# Patient Record
Sex: Female | Born: 1995 | Race: Black or African American | Hispanic: No | Marital: Single | Smoking: Never smoker
Health system: Southern US, Community
[De-identification: ages and names within clinical notes are randomized; demographics above are authoritative.]

## PROBLEM LIST (undated history)

## (undated) DIAGNOSIS — F988 Other specified behavioral and emotional disorders with onset usually occurring in childhood and adolescence: Secondary | ICD-10-CM

## (undated) DIAGNOSIS — R519 Headache, unspecified: Secondary | ICD-10-CM

## (undated) DIAGNOSIS — L80 Vitiligo: Secondary | ICD-10-CM

## (undated) DIAGNOSIS — M549 Dorsalgia, unspecified: Secondary | ICD-10-CM

## (undated) DIAGNOSIS — F329 Major depressive disorder, single episode, unspecified: Secondary | ICD-10-CM

## (undated) DIAGNOSIS — F419 Anxiety disorder, unspecified: Secondary | ICD-10-CM

## (undated) DIAGNOSIS — R7303 Prediabetes: Secondary | ICD-10-CM

## (undated) DIAGNOSIS — F32A Depression, unspecified: Secondary | ICD-10-CM

## (undated) DIAGNOSIS — G43909 Migraine, unspecified, not intractable, without status migrainosus: Secondary | ICD-10-CM

## (undated) DIAGNOSIS — M2550 Pain in unspecified joint: Secondary | ICD-10-CM

## (undated) DIAGNOSIS — E559 Vitamin D deficiency, unspecified: Secondary | ICD-10-CM

## (undated) DIAGNOSIS — R51 Headache: Secondary | ICD-10-CM

## (undated) HISTORY — DX: Anxiety disorder, unspecified: F41.9

## (undated) HISTORY — DX: Major depressive disorder, single episode, unspecified: F32.9

## (undated) HISTORY — DX: Migraine, unspecified, not intractable, without status migrainosus: G43.909

## (undated) HISTORY — DX: Headache: R51

## (undated) HISTORY — DX: Depression, unspecified: F32.A

## (undated) HISTORY — PX: WISDOM TOOTH EXTRACTION: SHX21

## (undated) HISTORY — DX: Dorsalgia, unspecified: M54.9

## (undated) HISTORY — DX: Vitamin D deficiency, unspecified: E55.9

## (undated) HISTORY — DX: Pain in unspecified joint: M25.50

## (undated) HISTORY — DX: Other specified behavioral and emotional disorders with onset usually occurring in childhood and adolescence: F98.8

## (undated) HISTORY — DX: Prediabetes: R73.03

## (undated) HISTORY — DX: Headache, unspecified: R51.9

---
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
## 2015-11-24 ENCOUNTER — Ambulatory Visit (HOSPITAL_COMMUNITY)
Admission: EM | Admit: 2015-11-24 | Discharge: 2015-11-24 | Disposition: A | Discharge status: Home / Self Care | Payer: BLUE CROSS/BLUE SHIELD | Attending: Emergency Medicine | Admitting: Emergency Medicine

## 2015-11-24 ENCOUNTER — Encounter (HOSPITAL_COMMUNITY): Payer: Self-pay | Admitting: Emergency Medicine

## 2015-11-24 DIAGNOSIS — R102 Pelvic and perineal pain: Secondary | ICD-10-CM | POA: Diagnosis not present

## 2015-11-24 LAB — POCT URINALYSIS DIP (DEVICE)
BILIRUBIN URINE: NEGATIVE
Glucose, UA: NEGATIVE mg/dL
Ketones, ur: NEGATIVE mg/dL
Nitrite: NEGATIVE
PH: 7 (ref 5.0–8.0)
Protein, ur: NEGATIVE mg/dL
SPECIFIC GRAVITY, URINE: 1.015 (ref 1.005–1.030)
UROBILINOGEN UA: 0.2 mg/dL (ref 0.0–1.0)

## 2015-11-24 LAB — POCT PREGNANCY, URINE: Preg Test, Ur: NEGATIVE

## 2015-11-24 NOTE — ED Provider Notes (Signed)
[facility]    CSN: [id number] Arrival date & time: 11/24/15  1701     History   Chief Complaint Chief Complaint  Patient presents with  . Abdominal Pain    HPI [name] is a 20 y.o. female.   HPI  She is a 20 year old woman here for evaluation of pelvic pain. She states this started today. She was at work when she developed a sharp pain in her lower abdomen and pelvis. It was quite severe and made it difficult for her to walk. She went to the bathroom, but did not have a bowel movement. She is not had any vaginal discharge or dysuria. The pain did eventually ease up. Since then she has had intermittent recurring episodes, but they only last maybe a minute at a time. No nausea or vomiting. Last bowel movement was yesterday and normal. Last period was October 17. She did just come off the Nexplanon. She has had unprotected sex, but not recently.  History reviewed. No pertinent past medical history.  There are no active problems to display for this patient.   History reviewed. No pertinent surgical history.  OB History    No data available       Home Medications    Prior to Admission medications   Not on File    Family History No family history on file.  Social History Social History  Substance Use Topics  . Smoking status: Never Smoker  . Smokeless tobacco: Never Used  . Alcohol use Yes     Allergies   Patient has no known allergies.   Review of Systems Review of Systems As in history of present illness  Physical Exam Triage Vital Signs ED Triage Vitals [11/24/15 1739]  Enc Vitals Group     BP 114/84     Pulse Rate 71     Resp 18     Temp 97.9 F (36.6 C)     Temp Source Oral     SpO2 100 %     Weight      Height      Head Circumference      Peak Flow      Pain Score      Pain Loc      Pain Edu?      Excl. in GC?    No data found.   Updated Vital Signs BP 114/84 (BP Location: Left Arm)   Pulse 71   Temp 97.9 F  (36.6 C) (Oral)   Resp 18   LMP 11/01/2015   SpO2 100%   Visual Acuity Right Eye Distance:   Left Eye Distance:   Bilateral Distance:    Right Eye Near:   Left Eye Near:    Bilateral Near:     Physical Exam  Constitutional: She is oriented to person, place, and time. She appears well-developed and well-nourished. No distress.  Cardiovascular: Normal rate.   Pulmonary/Chest: Effort normal.  Abdominal: Soft. Bowel sounds are normal. She exhibits no distension and no mass. There is tenderness (mild in suprapubic). There is no rebound and no guarding.  Genitourinary: There is no rash on the right labia. There is no rash on the left labia. Uterus is not enlarged and not tender. Cervix exhibits no motion tenderness and no discharge. Right adnexum displays no tenderness. Left adnexum displays no tenderness. There is bleeding in the vagina. No foreign body in the vagina. No vaginal discharge found.  Neurological: She is alert and oriented to person, place,  and time.     UC Treatments / Results  Labs (all labs ordered are listed, but only abnormal results are displayed) Labs Reviewed  POCT URINALYSIS DIP (DEVICE) - Abnormal; Notable for the following:       Result Value   Hgb urine dipstick LARGE (*)    Leukocytes, UA TRACE (*)    All other components within normal limits  URINE CULTURE  POCT PREGNANCY, URINE  CERVICOVAGINAL ANCILLARY ONLY    EKG  EKG Interpretation None       Radiology No results found.  Procedures Procedures (including critical care time)  Medications Ordered in UC Medications - No data to display   Initial Impression / Assessment and Plan / UC Course  I have reviewed the triage vital signs and the nursing notes.  Pertinent labs & imaging results that were available during my care of the patient were reviewed by me and considered in my medical decision making (see chart for details).  Clinical Course     STD testing collected. Urine sent for  culture. Suspect her pain is due to her menses starting. Recommended Tylenol or ibuprofen. Follow-up in the emergency room if pain persists or worsens.  Final Clinical Impressions(s) / UC Diagnoses   Final diagnoses:  Pelvic pain in female    New Prescriptions There are no discharge medications for this patient.    Charm RingsErin J Rodrigo Mcgranahan, MD 11/24/15 [phone]

## 2015-11-24 NOTE — ED Triage Notes (Signed)
Here for intermittent sharp pelvic pain onset today  Pain increased when she was walking  LBM - yest   Denies fevers, chills, urinary sx, n/v

## 2015-11-24 NOTE — Discharge Instructions (Signed)
We are checking for infection. There is no sign of anything bad going on. The pain may be related to the onset of your period. Take some ibuprofen tonight. If the pain comes back and is persistent, please go to the emergency room.

## 2015-11-25 LAB — URINE CULTURE

## 2015-11-25 LAB — CERVICOVAGINAL ANCILLARY ONLY: Wet Prep (BD Affirm): POSITIVE — AB

## 2015-11-30 ENCOUNTER — Telehealth (HOSPITAL_COMMUNITY): Payer: Self-pay | Admitting: Emergency Medicine

## 2015-11-30 LAB — CERVICOVAGINAL ANCILLARY ONLY
Chlamydia: NEGATIVE
Neisseria Gonorrhea: NEGATIVE

## 2015-11-30 NOTE — Telephone Encounter (Signed)
-----   Message from Eustace MooreLaura W Murray, MD sent at 11/27/2015  8:15 PM EST ----- Please let patient know that urine culture did not demonstrate a UTI.   Test for gardnerella (bacterial vaginosis) was positive, but only needs to be treated if there are symptoms such as vaginal irritation or discharge. If having vaginal irritation/discharge, ok to send rx for metronidazole 500mg  bid x 7d #14 no refills.  LM

## 2015-12-02 NOTE — Telephone Encounter (Signed)
Called [phone], some one picked up but line was hanged/cut off.... Called again and LM on VM  Need to give lab results and to see how pt is doing from recent visit on 11/15 Notified pt in gen message that there is NO need to call back Unless not feeling any better, not tolerating meds well or if wanting to know lab results. Also let pt know labs can be obtained from MyChart

## 2017-01-15 HISTORY — PX: MOUTH SURGERY: SHX715

## 2018-03-26 ENCOUNTER — Ambulatory Visit (INDEPENDENT_AMBULATORY_CARE_PROVIDER_SITE_OTHER): Payer: Managed Care, Other (non HMO) | Admitting: Nurse Practitioner

## 2018-03-26 ENCOUNTER — Other Ambulatory Visit (HOSPITAL_COMMUNITY)
Admission: RE | Admit: 2018-03-26 | Discharge: 2018-03-26 | Disposition: A | Discharge status: Home / Self Care | Payer: Managed Care, Other (non HMO) | Source: Ambulatory Visit | Attending: Nurse Practitioner | Admitting: Nurse Practitioner

## 2018-03-26 ENCOUNTER — Encounter: Payer: Self-pay | Admitting: Nurse Practitioner

## 2018-03-26 ENCOUNTER — Other Ambulatory Visit: Payer: Self-pay

## 2018-03-26 VITALS — BP 126/76 | HR 105 | Temp 97.9°F | Ht 64.5 in | Wt 243.6 lb

## 2018-03-26 DIAGNOSIS — Z124 Encounter for screening for malignant neoplasm of cervix: Secondary | ICD-10-CM | POA: Diagnosis not present

## 2018-03-26 DIAGNOSIS — Z0000 Encounter for general adult medical examination without abnormal findings: Secondary | ICD-10-CM | POA: Diagnosis not present

## 2018-03-26 DIAGNOSIS — Z114 Encounter for screening for human immunodeficiency virus [HIV]: Secondary | ICD-10-CM | POA: Diagnosis not present

## 2018-03-26 DIAGNOSIS — Z136 Encounter for screening for cardiovascular disorders: Secondary | ICD-10-CM

## 2018-03-26 DIAGNOSIS — N760 Acute vaginitis: Secondary | ICD-10-CM

## 2018-03-26 DIAGNOSIS — Z0001 Encounter for general adult medical examination with abnormal findings: Secondary | ICD-10-CM

## 2018-03-26 DIAGNOSIS — Z13220 Encounter for screening for lipoid disorders: Secondary | ICD-10-CM

## 2018-03-26 DIAGNOSIS — Z23 Encounter for immunization: Secondary | ICD-10-CM

## 2018-03-26 DIAGNOSIS — L80 Vitiligo: Secondary | ICD-10-CM

## 2018-03-26 DIAGNOSIS — B9689 Other specified bacterial agents as the cause of diseases classified elsewhere: Secondary | ICD-10-CM

## 2018-03-26 DIAGNOSIS — Z113 Encounter for screening for infections with a predominantly sexual mode of transmission: Secondary | ICD-10-CM

## 2018-03-26 NOTE — Assessment & Plan Note (Signed)
Advised about the importance of healthy diet and regular exercise to promote weight loss

## 2018-03-26 NOTE — Progress Notes (Addendum)
Subjective:    Patient ID: Jodi Downs, female    DOB: July 15, 1995, 23 y.o.   MRN: [id number]  Patient presents today for complete physical   HPI  previous pcp and GYN in [place], [place]. None seen in over 3years due to lack of insurance.  Hx of ADHD and Anxiety: use of medication while in high school and 1st year of college.  Hx of migraines  Hx of vitiligo: evaluated by dermatology in [place], [place]  Sexual History (orientation,birth control, marital status, STD):no hx of abnormal PAP Home with female partner. Female partner in past  Depression/Suicide:stable mood at this time per patient Depression screen [facility] 2/9 03/26/2018  Decreased Interest 0  Down, Depressed, Hopeless 0  PHQ - 2 Score 0   Vision:up to date, use of corrective lens  Dental: will schedule  Immunizations: (TDAP, Hep C screen, Pneumovax, Influenza, zoster)  Health Maintenance  Topic Date Due  . Tetanus Vaccine  07/16/2012  . PAP-Cervical Cytology Screening  05/07/2018*  . Pap Smear  05/07/2018*  . Flu Shot  Completed  . HIV Screening  Completed  *Topic was postponed. The date shown is not the original due date.   Diet: regular.  Weight:  Wt Readings from Last 3 Encounters:  03/26/18 243 lb 9.6 oz (110.5 kg)   Exercise:none  Fall Risk: Fall Risk  03/26/2018  Falls in the past year? 0   Medications and allergies reviewed with patient and updated if appropriate.  Patient Active Problem List   Diagnosis Date Noted  . Vitiligo 03/26/2018  . Morbid obesity (HCC) 03/26/2018    No current outpatient medications on file prior to visit.   No current facility-administered medications on file prior to visit.     Past Medical History:  Diagnosis Date  . Depression   . Frequent headaches   . Migraines     Past Surgical History:  Procedure Laterality Date  . MOUTH SURGERY Bilateral 2019   wisdom teeth removal , tooth extraction w/ bone graph 2019  . TONSILLECTOMY AND ADENOIDECTOMY  2000   . WISDOM TOOTH EXTRACTION      Social History   Socioeconomic History  . Marital status: Single    Spouse name: Not on file  . Number of children: Not on file  . Years of education: Not on file  . Highest education level: Not on file  Occupational History  . Not on file  Social Needs  . Financial resource strain: Not on file  . Food insecurity:    Worry: Not on file    Inability: Not on file  . Transportation needs:    Medical: Not on file    Non-medical: Not on file  Tobacco Use  . Smoking status: Never Smoker  . Smokeless tobacco: Never Used  Substance and Sexual Activity  . Alcohol use: Yes    Comment: social  . Drug use: No  . Sexual activity: Yes    Birth control/protection: Condom  Lifestyle  . Physical activity:    Days per week: Not on file    Minutes per session: Not on file  . Stress: Not on file  Relationships  . Social connections:    Talks on phone: Not on file    Gets together: Not on file    Attends religious service: Not on file    Active member of club or organization: Not on file    Attends meetings of clubs or organizations: Not on file    Relationship status: Not  on file  Other Topics Concern  . Not on file  Social History Narrative  . Not on file    Family History  Problem Relation Age of Onset  . Asthma Mother   . Hyperlipidemia Mother   . Hypertension Mother   . Heart disease Father   . Hyperlipidemia Father   . Hypertension Father   . Alcohol abuse Father   . Learning disabilities Brother   . Intellectual disability Brother   . Diabetes Maternal Grandmother   . Stroke Maternal Grandmother   . Diabetes Paternal Grandfather   . Diabetes Maternal Aunt        Review of Systems  Constitutional: Negative for fever, malaise/fatigue and weight loss.  HENT: Negative for congestion and sore throat.   Eyes:       Negative for visual changes  Respiratory: Negative for cough and shortness of breath.   Cardiovascular: Negative for  chest pain, palpitations and leg swelling.  Gastrointestinal: Negative for blood in stool, constipation, diarrhea, heartburn and nausea.  Genitourinary: Negative for dysuria, frequency and urgency.  Musculoskeletal: Negative for falls, joint pain and myalgias.  Skin: Negative for rash.  Neurological: Negative for dizziness, sensory change and headaches.  Endo/Heme/Allergies: Does not bruise/bleed easily.  Psychiatric/Behavioral: Negative for depression, substance abuse and suicidal ideas. The patient is not nervous/anxious and does not have insomnia.     Objective:   Vitals:   03/26/18 1452  BP: 126/76  Pulse: (!) 105  Temp: 97.9 F (36.6 C)  SpO2: 97%    Body mass index is 41.17 kg/m.  Physical Examination:  Physical Exam Vitals signs reviewed. Exam conducted with a chaperone present.  Cardiovascular:     Rate and Rhythm: Normal rate and regular rhythm.  Chest:     Breasts:        Right: Normal.        Left: Normal.  Genitourinary:    General: Normal vulva.     Vagina: Normal.     Cervix: Normal.     Adnexa: Right adnexa normal and left adnexa normal.  Lymphadenopathy:     Upper Body:     Right upper body: No supraclavicular, axillary or pectoral adenopathy.     Left upper body: No supraclavicular, axillary or pectoral adenopathy.     Lower Body: No right inguinal adenopathy. No left inguinal adenopathy.  Neurological:     Mental Status: She is alert.    ASSESSMENT and PLAN:  Marvette was seen today for establish care.  Diagnoses and all orders for this visit:  Encounter for preventative adult health care exam with abnormal findings -     Cancel: CBC -     Comprehensive metabolic panel -     Cytology - PAP( [place]) -     CBC w/Diff  Encounter for lipid screening for cardiovascular disease  Encounter for Papanicolaou smear for cervical cancer screening -     Cytology - PAP( [place])  Morbid obesity (HCC) -     TSH -     Lipid panel -      Hemoglobin A1c  Encounter for screening for human immunodeficiency virus (HIV) -     HIV antibody (with reflex)  Screening examination for STD (sexually transmitted disease) -     Cervicovaginal ancillary only( [place])  Need for diphtheria-tetanus-pertussis (Tdap) vaccine -     Cancel: Tdap vaccine greater than or equal to 7yo IM  Need for influenza vaccination -     Flu  Vaccine QUAD 36+ mos IM  Vitiligo  BV (bacterial vaginosis) -     metroNIDAZOLE (METROGEL VAGINAL) 0.75 % vaginal gel; Place 1 Applicatorful vaginally at bedtime.   Morbid obesity (HCC) Advised about the importance of healthy diet and regular exercise to promote weight loss  Vitiligo Hypopigmentation of right leg     Problem List Items Addressed This Visit      Musculoskeletal and Integument   Vitiligo    Hypopigmentation of right leg        Other   Morbid obesity (HCC)    Advised about the importance of healthy diet and regular exercise to promote weight loss      Relevant Orders   TSH (Completed)   Lipid panel (Completed)   Hemoglobin A1c (Completed)    Other Visit Diagnoses    Encounter for preventative adult health care exam with abnormal findings    -  Primary   Relevant Orders   Comprehensive metabolic panel (Completed)   Cytology - PAP( [place])   CBC w/Diff (Completed)   Encounter for lipid screening for cardiovascular disease       Encounter for Papanicolaou smear for cervical cancer screening       Relevant Orders   Cytology - PAP( [place])   Encounter for screening for human immunodeficiency virus (HIV)       Relevant Orders   HIV antibody (with reflex) (Completed)   Screening examination for STD (sexually transmitted disease)       Relevant Orders   Cervicovaginal ancillary only( ) (Completed)   Need for diphtheria-tetanus-pertussis (Tdap) vaccine       Need for influenza vaccination       Relevant Orders   Flu Vaccine QUAD 36+ mos IM (Completed)    BV (bacterial vaginosis)       Relevant Medications   metroNIDAZOLE (METROGEL VAGINAL) 0.75 % vaginal gel       Follow up: Return in about 4 weeks (around 04/23/2018) for anxiety and weight management ( ).  Alysia Penna, NP

## 2018-03-26 NOTE — Assessment & Plan Note (Signed)
Hypopigmentation of right leg

## 2018-03-26 NOTE — Patient Instructions (Addendum)
Go to lab for blood draw. You will be contacted with results.  Strongly encourage to start heart healthy diet and regular exercise

## 2018-03-27 ENCOUNTER — Encounter: Payer: Self-pay | Admitting: Nurse Practitioner

## 2018-03-27 LAB — COMPREHENSIVE METABOLIC PANEL
ALBUMIN: 4 g/dL (ref 3.5–5.2)
ALK PHOS: 54 U/L (ref 39–117)
ALT: 10 U/L (ref 0–35)
AST: 18 U/L (ref 0–37)
BILIRUBIN TOTAL: 0.3 mg/dL (ref 0.2–1.2)
BUN: 16 mg/dL (ref 6–23)
CO2: 27 mEq/L (ref 19–32)
CREATININE: 0.76 mg/dL (ref 0.40–1.20)
Calcium: 8.7 mg/dL (ref 8.4–10.5)
Chloride: 106 mEq/L (ref 96–112)
GFR: 112.48 mL/min (ref >60.00)
Glucose, Bld: 86 mg/dL (ref 70–99)
Potassium: 3.8 mEq/L (ref 3.5–5.1)
SODIUM: 139 meq/L (ref 135–145)
Total Protein: 7.2 g/dL (ref 6.0–8.3)

## 2018-03-27 LAB — CBC WITH DIFFERENTIAL/PLATELET
BASOS ABS: 0.1 10*3/uL (ref 0.0–0.1)
Basophils Relative: 0.9 % (ref 0.0–3.0)
Eosinophils Absolute: 0.1 10*3/uL (ref 0.0–0.7)
Eosinophils Relative: 1.2 % (ref 0.0–5.0)
HEMATOCRIT: 32.2 % — AB (ref 36.0–46.0)
HEMOGLOBIN: 10.5 g/dL — AB (ref 12.0–15.0)
LYMPHS ABS: 2.4 10*3/uL (ref 0.7–4.0)
LYMPHS PCT: 34.6 % (ref 12.0–46.0)
MCHC: 32.5 g/dL (ref 30.0–36.0)
MCV: 84.2 fl (ref 78.0–100.0)
MONOS PCT: 6.5 % (ref 3.0–12.0)
Monocytes Absolute: 0.4 10*3/uL (ref 0.1–1.0)
NEUTROS PCT: 56.8 % (ref 43.0–77.0)
Neutro Abs: 3.9 10*3/uL (ref 1.4–7.7)
Platelets: 322 10*3/uL (ref 150.0–400.0)
RBC: 3.83 Mil/uL — AB (ref 3.87–5.11)
RDW: 14.8 % (ref 11.5–15.5)
WBC: 6.8 10*3/uL (ref 4.0–10.5)

## 2018-03-27 LAB — HEMOGLOBIN A1C: HEMOGLOBIN A1C: 5.6 % (ref 4.6–6.5)

## 2018-03-27 LAB — TSH: TSH: 2.78 u[IU]/mL (ref 0.35–4.50)

## 2018-03-27 LAB — LIPID PANEL
CHOLESTEROL: 144 mg/dL (ref 0–200)
HDL: 50.8 mg/dL (ref >39.00)
LDL Cholesterol: 82 mg/dL (ref 0–99)
NonHDL: 93.68
TRIGLYCERIDES: 59 mg/dL (ref 0.0–149.0)
Total CHOL/HDL Ratio: 3
VLDL: 11.8 mg/dL (ref 0.0–40.0)

## 2018-03-27 LAB — HIV ANTIBODY (ROUTINE TESTING W REFLEX): HIV: NONREACTIVE

## 2018-03-28 LAB — CERVICOVAGINAL ANCILLARY ONLY
Bacterial vaginitis: POSITIVE — AB
CANDIDA VAGINITIS: NEGATIVE
CHLAMYDIA, DNA PROBE: NEGATIVE
NEISSERIA GONORRHEA: NEGATIVE
TRICH (WINDOWPATH): NEGATIVE

## 2018-03-28 MED ORDER — METRONIDAZOLE 0.75 % VA GEL: 1.0000 | Freq: Every day | VAGINAL | 0 refills | Status: DC

## 2018-03-28 NOTE — Addendum Note (Signed)
Addended by: Michaela Corner on: 03/28/2018 05:41 PM   Modules accepted: Orders

## 2018-03-31 LAB — CYTOLOGY - PAP: DIAGNOSIS: NEGATIVE

## 2018-04-22 ENCOUNTER — Ambulatory Visit: Payer: Managed Care, Other (non HMO) | Admitting: Nurse Practitioner

## 2021-02-15 DIAGNOSIS — Z419 Encounter for procedure for purposes other than remedying health state, unspecified: Secondary | ICD-10-CM | POA: Diagnosis not present

## 2021-02-17 LAB — OB RESULTS CONSOLE ABO/RH: RH Type: POSITIVE

## 2021-02-17 LAB — OB RESULTS CONSOLE RUBELLA ANTIBODY, IGM: Rubella: IMMUNE

## 2021-02-17 LAB — OB RESULTS CONSOLE HEPATITIS B SURFACE ANTIGEN: Hepatitis B Surface Ag: NEGATIVE

## 2021-02-17 LAB — HEPATITIS C ANTIBODY: HCV Ab: NEGATIVE

## 2021-02-17 LAB — OB RESULTS CONSOLE HIV ANTIBODY (ROUTINE TESTING): HIV: NONREACTIVE

## 2021-02-17 LAB — OB RESULTS CONSOLE RPR: RPR: NONREACTIVE

## 2021-02-17 LAB — OB RESULTS CONSOLE ANTIBODY SCREEN: Antibody Screen: NEGATIVE

## 2021-03-15 DIAGNOSIS — Z419 Encounter for procedure for purposes other than remedying health state, unspecified: Secondary | ICD-10-CM | POA: Diagnosis not present

## 2021-04-15 DIAGNOSIS — Z419 Encounter for procedure for purposes other than remedying health state, unspecified: Secondary | ICD-10-CM | POA: Diagnosis not present

## 2021-05-15 DIAGNOSIS — Z419 Encounter for procedure for purposes other than remedying health state, unspecified: Secondary | ICD-10-CM | POA: Diagnosis not present

## 2021-05-16 ENCOUNTER — Other Ambulatory Visit: Payer: Self-pay | Admitting: Obstetrics & Gynecology

## 2021-05-16 DIAGNOSIS — Z363 Encounter for antenatal screening for malformations: Secondary | ICD-10-CM

## 2021-05-26 ENCOUNTER — Encounter: Payer: Self-pay | Admitting: *Deleted

## 2021-05-31 ENCOUNTER — Ambulatory Visit: Payer: BC Managed Care – PPO | Attending: Obstetrics & Gynecology

## 2021-05-31 ENCOUNTER — Encounter: Payer: Self-pay | Admitting: *Deleted

## 2021-05-31 ENCOUNTER — Ambulatory Visit: Payer: BC Managed Care – PPO | Admitting: *Deleted

## 2021-05-31 ENCOUNTER — Other Ambulatory Visit: Payer: Self-pay | Admitting: *Deleted

## 2021-05-31 VITALS — BP 113/66 | HR 99

## 2021-05-31 DIAGNOSIS — Z6841 Body Mass Index (BMI) 40.0 and over, adult: Secondary | ICD-10-CM

## 2021-05-31 DIAGNOSIS — Z362 Encounter for other antenatal screening follow-up: Secondary | ICD-10-CM

## 2021-05-31 DIAGNOSIS — Z363 Encounter for antenatal screening for malformations: Secondary | ICD-10-CM | POA: Diagnosis not present

## 2021-05-31 DIAGNOSIS — R638 Other symptoms and signs concerning food and fluid intake: Secondary | ICD-10-CM

## 2021-05-31 IMAGING — US US MFM OB DETAIL+14 WK
1 series · 13 of 28 positions shown · non-contrast
Comparison: none

[Series 1: us mfm ob detail+14 wk · 139 acquisitions, 13 frames shown]
[im 6/139]
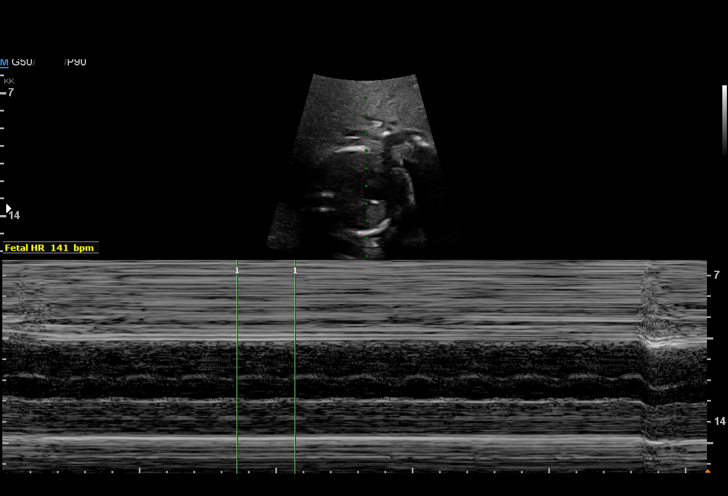
[im 16/139]
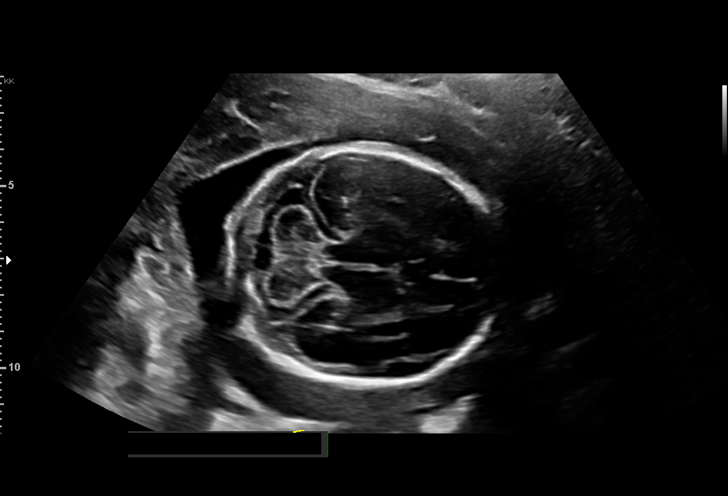
[im 26/139]
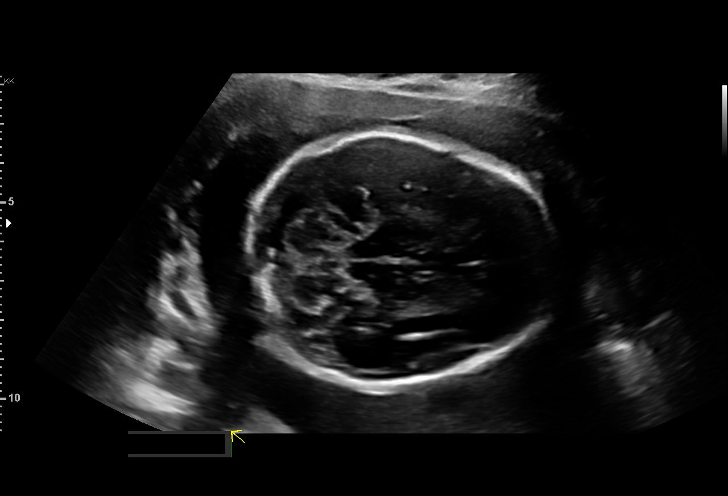
[im 36/139]
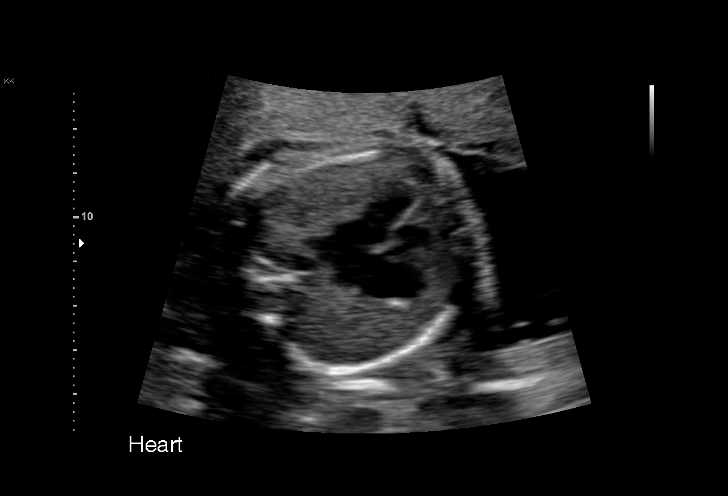
[im 47/139]
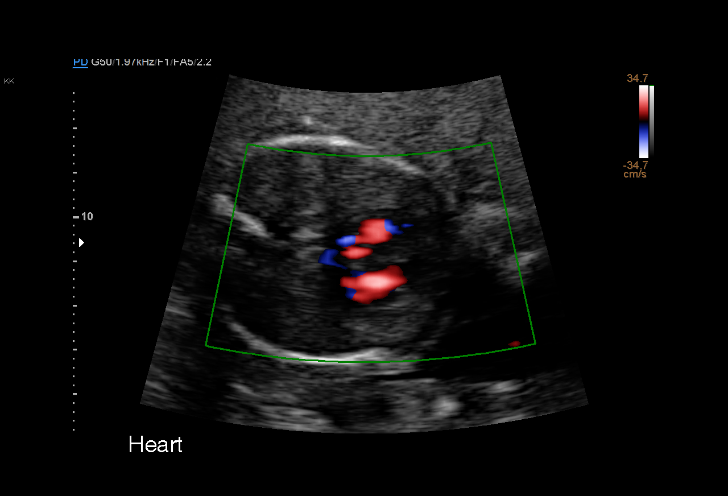
[im 57/139]
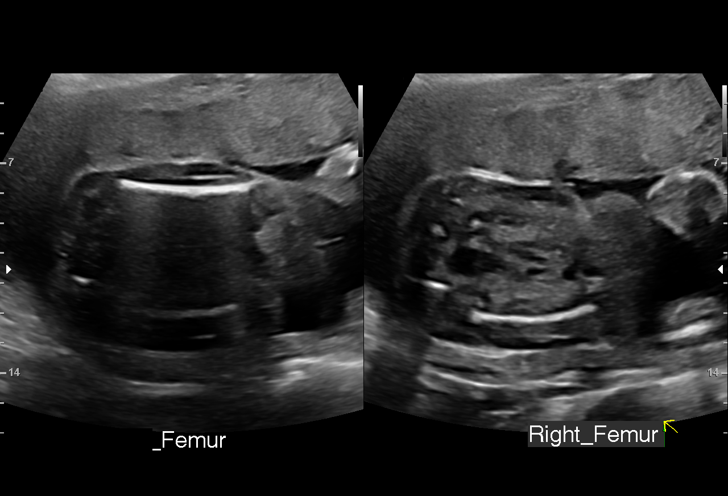
[im 72/139]
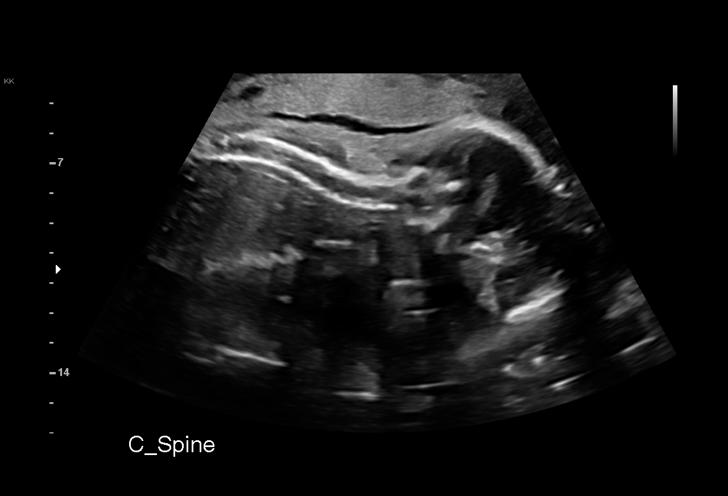
[im 82/139]
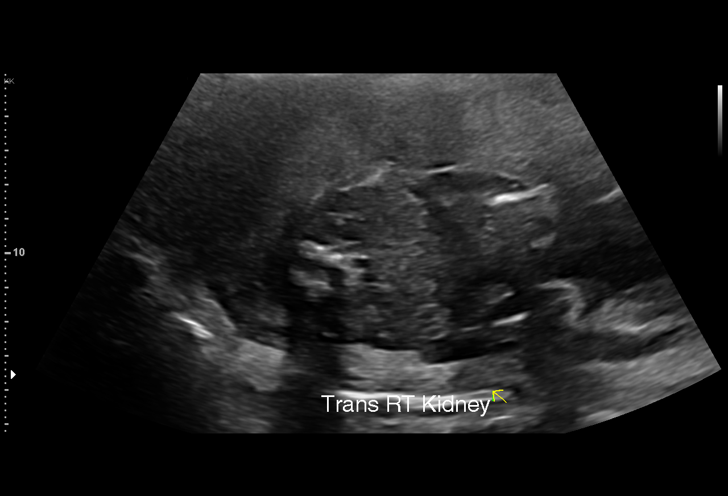
[im 93/139]
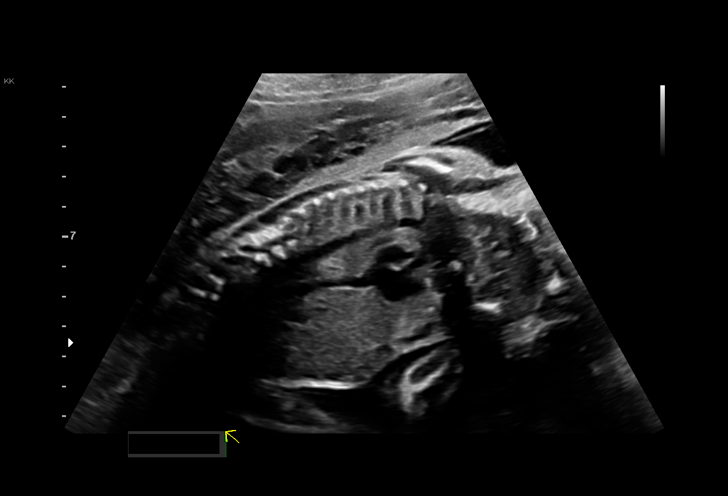
[im 103/139]
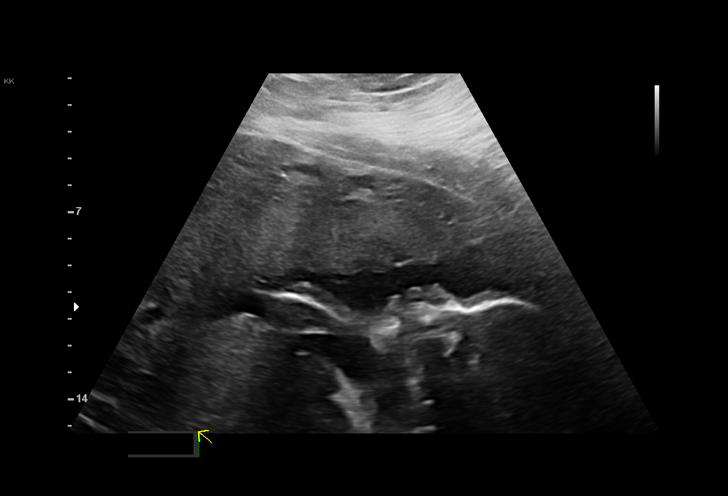
[im 113/139]
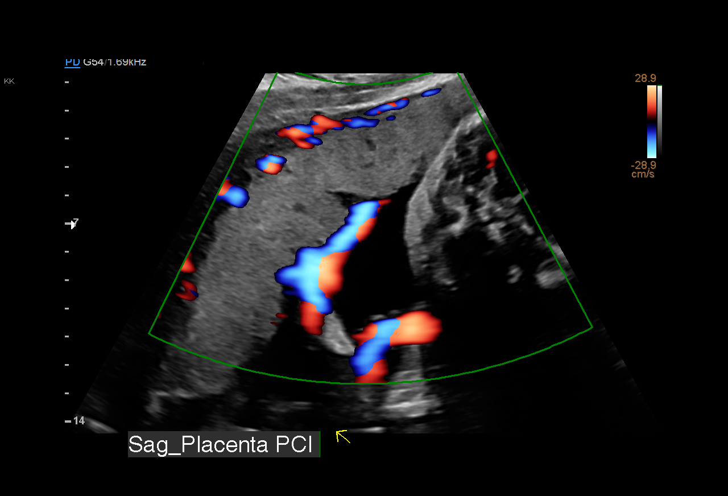
[im 123/139]
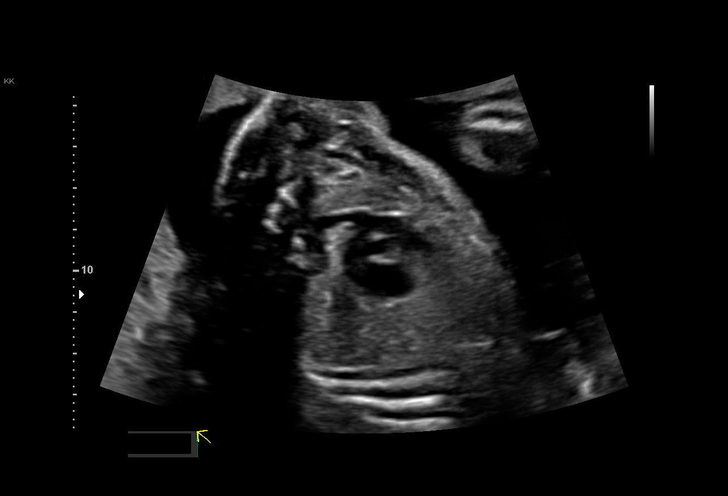
[im 133/139]
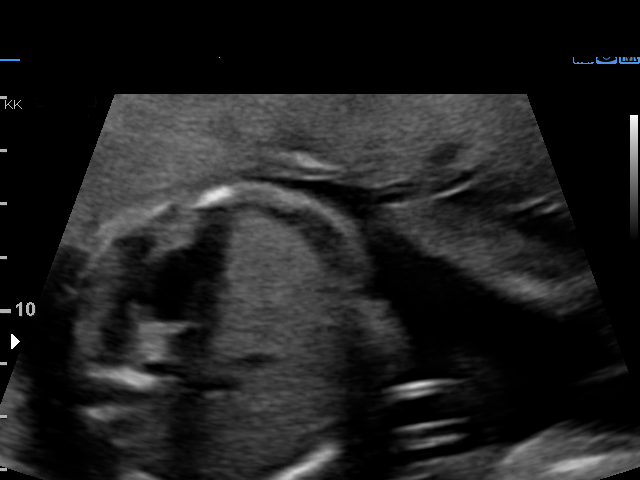

[13 of 28 positions shown; findings below may reference images not displayed]

OB

Indications

 Obesity complicating pregnancy, second         [IH]
 trimester (BMI 41)
 25 weeks gestation of pregnancy
 Encounter for antenatal screening for          [IH]
 malformations
 Fetal abnormality - other known or             [IH]
 suspected (Persistent Right Umbilical Vein)
 Low Risk NIPS(Negative Horizon)
Vital Signs

 BMI:
Fetal Evaluation

 Num Of Fetuses:         1
 Fetal Heart Rate(bpm):  141
 Cardiac Activity:       Observed
 Presentation:           Cephalic
 Placenta:               Anterior
 P. Cord Insertion:      Visualized

 Amniotic Fluid
 AFI FV:      Within normal limits

                             Largest Pocket(cm)

Biometry

 BPD:      63.5  mm     G. Age:  25w 5d         67  %    CI:        74.79   %    70 - 86
                                                         FL/HC:      19.5   %    18.7 -
 HC:       233   mm     G. Age:  25w 2d         41  %    HC/AC:      1.14        1.04 -
 AC:      205.2  mm     G. Age:  25w 1d         44  %    FL/BPD:     71.7   %    71 - 87
 FL:       45.5  mm     G. Age:  25w 0d         38  %    FL/AC:      22.2   %    20 - 24
 HUM:      45.1  mm     G. Age:  26w 5d         85  %
 CER:      28.6  mm     G. Age:  25w 2d         60  %

 LV:        4.5  mm
 CM:        4.2  mm

 Est. FW:     775  gm    1 lb 11 oz      46  %
OB History

 Gravidity:    1
Gestational Age

 LMP:           24w 0d        Date:  [DATE]                 EDD:   [DATE]
 U/S Today:     25w 2d                                        EDD:   [DATE]
 Best:          25w 0d     Det. By:  Early Ultrasound         EDD:   [DATE]
                                     ([DATE])
Anatomy

 Cranium:               Appears normal         Aortic Arch:            Appears normal
 Cavum:                 Appears normal         Ductal Arch:            Appears normal
 Ventricles:            Appears normal         Diaphragm:              Appears normal
 Choroid Plexus:        Appears normal         Stomach:                Appears normal, left
                                                                       sided
 Cerebellum:            Appears normal         Abdomen:                Persistent right
                                                                       umbilical vein
 Posterior Fossa:       Appears normal         Abdominal Wall:         Appears nml (cord
                                                                       insert, abd wall)
 Nuchal Fold:           Not applicable (>20    Cord Vessels:           Appears normal (3
                        wks GA)                                        vessel cord)
 Face:                  Appears normal         Kidneys:                Appear normal
                        (orbits and profile)
 Lips:                  Appears normal         Bladder:                Appears normal
 Thoracic:              Appears normal         Spine:                  Not well visualized
 Heart:                 Appears normal         Upper Extremities:      Appears normal
                        (4CH, axis, and
                        situs)
 RVOT:                  Not well visualized    Lower Extremities:      Appears normal
 LVOT:                  Not well visualized

 Other:  Fetus appears to be a male. VC, 3VV and 3VTV visualized.
         Technically difficult due to maternal habitus and fetal position.
Cervix Uterus Adnexa

 Cervix
 Length:           3.07  cm.
 Normal appearance by transabdominal scan.
 Adnexa
 No abnormality visualized.
Impression

 Single intrauterine pregnancy here for a detailed anatomy
 Normal anatomy with measurements consistent with dates
 There is good fetal movement and amniotic fluid volume
 Suboptimal views of the fetal anatomy were obtained
 secondary to fetal position.

 We observed an isolated intrahepatic persistent right
 umbilical vein PRUV. There are no markers of aneuploidy
 and she has a low risk NIPS. I explained that typically the
 right umbilical vein obliterates and the left persists. PRUV is
 suggested when a medial gallbladder is seen with the
 umbilical vein coursing around it toward the stomach. PRUV
 can be intrahepatic or extrahepatic. Extrahepatic is more rare
 finding and often associated with a single umbilical artery and
 anomalies. Trisomy 18 is the most common associated
 aneuploidy. No markers of aneuploidy were seen today.
Recommendations

 Follow up growth in 4 weeks to complete the fetal anatomy.

## 2021-06-15 DIAGNOSIS — Z419 Encounter for procedure for purposes other than remedying health state, unspecified: Secondary | ICD-10-CM | POA: Diagnosis not present

## 2021-06-30 ENCOUNTER — Ambulatory Visit: Payer: BC Managed Care – PPO

## 2021-07-07 ENCOUNTER — Ambulatory Visit: Payer: Managed Care, Other (non HMO)

## 2021-07-07 ENCOUNTER — Ambulatory Visit: Payer: BC Managed Care – PPO | Attending: Maternal & Fetal Medicine

## 2021-07-12 ENCOUNTER — Ambulatory Visit: Payer: BC Managed Care – PPO | Admitting: *Deleted

## 2021-07-12 ENCOUNTER — Ambulatory Visit: Payer: BC Managed Care – PPO | Attending: Maternal & Fetal Medicine

## 2021-07-12 VITALS — BP 110/69 | HR 105

## 2021-07-12 DIAGNOSIS — R638 Other symptoms and signs concerning food and fluid intake: Secondary | ICD-10-CM | POA: Diagnosis present

## 2021-07-12 DIAGNOSIS — Z3A31 31 weeks gestation of pregnancy: Secondary | ICD-10-CM | POA: Diagnosis not present

## 2021-07-12 DIAGNOSIS — Z362 Encounter for other antenatal screening follow-up: Secondary | ICD-10-CM | POA: Diagnosis present

## 2021-07-12 DIAGNOSIS — O99213 Obesity complicating pregnancy, third trimester: Secondary | ICD-10-CM | POA: Insufficient documentation

## 2021-07-12 DIAGNOSIS — O358XX0 Maternal care for other (suspected) fetal abnormality and damage, not applicable or unspecified: Secondary | ICD-10-CM | POA: Diagnosis not present

## 2021-07-12 DIAGNOSIS — E669 Obesity, unspecified: Secondary | ICD-10-CM

## 2021-07-12 DIAGNOSIS — Z6841 Body Mass Index (BMI) 40.0 and over, adult: Secondary | ICD-10-CM | POA: Insufficient documentation

## 2021-07-13 ENCOUNTER — Other Ambulatory Visit: Payer: Self-pay | Admitting: *Deleted

## 2021-07-13 DIAGNOSIS — Z6841 Body Mass Index (BMI) 40.0 and over, adult: Secondary | ICD-10-CM

## 2021-07-15 DIAGNOSIS — Z419 Encounter for procedure for purposes other than remedying health state, unspecified: Secondary | ICD-10-CM | POA: Diagnosis not present

## 2021-08-03 ENCOUNTER — Other Ambulatory Visit: Payer: Self-pay | Admitting: *Deleted

## 2021-08-03 ENCOUNTER — Ambulatory Visit: Payer: BC Managed Care – PPO | Attending: Obstetrics and Gynecology | Admitting: *Deleted

## 2021-08-03 ENCOUNTER — Ambulatory Visit: Payer: BC Managed Care – PPO

## 2021-08-03 ENCOUNTER — Ambulatory Visit (HOSPITAL_BASED_OUTPATIENT_CLINIC_OR_DEPARTMENT_OTHER): Payer: BC Managed Care – PPO

## 2021-08-03 VITALS — BP 96/50 | HR 102

## 2021-08-03 DIAGNOSIS — O99213 Obesity complicating pregnancy, third trimester: Secondary | ICD-10-CM | POA: Insufficient documentation

## 2021-08-03 DIAGNOSIS — E669 Obesity, unspecified: Secondary | ICD-10-CM | POA: Diagnosis not present

## 2021-08-03 DIAGNOSIS — Z3A34 34 weeks gestation of pregnancy: Secondary | ICD-10-CM | POA: Insufficient documentation

## 2021-08-03 DIAGNOSIS — O283 Abnormal ultrasonic finding on antenatal screening of mother: Secondary | ICD-10-CM

## 2021-08-03 DIAGNOSIS — O99210 Obesity complicating pregnancy, unspecified trimester: Secondary | ICD-10-CM

## 2021-08-03 DIAGNOSIS — O359XX0 Maternal care for (suspected) fetal abnormality and damage, unspecified, not applicable or unspecified: Secondary | ICD-10-CM

## 2021-08-03 DIAGNOSIS — Z6841 Body Mass Index (BMI) 40.0 and over, adult: Secondary | ICD-10-CM

## 2021-08-10 ENCOUNTER — Ambulatory Visit: Payer: BC Managed Care – PPO | Admitting: *Deleted

## 2021-08-10 ENCOUNTER — Encounter: Payer: Self-pay | Admitting: *Deleted

## 2021-08-10 ENCOUNTER — Ambulatory Visit: Payer: BC Managed Care – PPO | Attending: Obstetrics and Gynecology

## 2021-08-10 VITALS — BP 119/59 | HR 104

## 2021-08-10 DIAGNOSIS — O99213 Obesity complicating pregnancy, third trimester: Secondary | ICD-10-CM | POA: Insufficient documentation

## 2021-08-10 DIAGNOSIS — E669 Obesity, unspecified: Secondary | ICD-10-CM | POA: Diagnosis not present

## 2021-08-10 DIAGNOSIS — O358XX0 Maternal care for other (suspected) fetal abnormality and damage, not applicable or unspecified: Secondary | ICD-10-CM

## 2021-08-10 DIAGNOSIS — Z3A35 35 weeks gestation of pregnancy: Secondary | ICD-10-CM | POA: Diagnosis not present

## 2021-08-10 DIAGNOSIS — O26893 Other specified pregnancy related conditions, third trimester: Secondary | ICD-10-CM | POA: Diagnosis present

## 2021-08-10 DIAGNOSIS — Z6841 Body Mass Index (BMI) 40.0 and over, adult: Secondary | ICD-10-CM

## 2021-08-15 DIAGNOSIS — Z419 Encounter for procedure for purposes other than remedying health state, unspecified: Secondary | ICD-10-CM | POA: Diagnosis not present

## 2021-08-16 ENCOUNTER — Ambulatory Visit: Payer: BC Managed Care – PPO | Admitting: *Deleted

## 2021-08-16 ENCOUNTER — Ambulatory Visit: Payer: BC Managed Care – PPO | Attending: Obstetrics and Gynecology

## 2021-08-16 VITALS — BP 105/77 | HR 137

## 2021-08-16 DIAGNOSIS — Z3A36 36 weeks gestation of pregnancy: Secondary | ICD-10-CM | POA: Diagnosis not present

## 2021-08-16 DIAGNOSIS — Z6841 Body Mass Index (BMI) 40.0 and over, adult: Secondary | ICD-10-CM | POA: Diagnosis present

## 2021-08-16 DIAGNOSIS — O359XX0 Maternal care for (suspected) fetal abnormality and damage, unspecified, not applicable or unspecified: Secondary | ICD-10-CM | POA: Diagnosis not present

## 2021-08-16 DIAGNOSIS — O358XX0 Maternal care for other (suspected) fetal abnormality and damage, not applicable or unspecified: Secondary | ICD-10-CM | POA: Diagnosis not present

## 2021-08-16 DIAGNOSIS — O99213 Obesity complicating pregnancy, third trimester: Secondary | ICD-10-CM | POA: Diagnosis not present

## 2021-08-16 DIAGNOSIS — E669 Obesity, unspecified: Secondary | ICD-10-CM | POA: Insufficient documentation

## 2021-08-18 LAB — OB RESULTS CONSOLE GBS: GBS: NEGATIVE

## 2021-08-25 ENCOUNTER — Ambulatory Visit: Payer: BC Managed Care – PPO | Admitting: *Deleted

## 2021-08-25 ENCOUNTER — Encounter: Payer: Self-pay | Admitting: *Deleted

## 2021-08-25 ENCOUNTER — Ambulatory Visit: Payer: BC Managed Care – PPO | Attending: Maternal & Fetal Medicine

## 2021-08-25 VITALS — BP 112/57 | HR 98

## 2021-08-25 DIAGNOSIS — Z6841 Body Mass Index (BMI) 40.0 and over, adult: Secondary | ICD-10-CM

## 2021-08-25 DIAGNOSIS — E669 Obesity, unspecified: Secondary | ICD-10-CM

## 2021-08-25 DIAGNOSIS — O35BXX0 Maternal care for other (suspected) fetal abnormality and damage, fetal cardiac anomalies, not applicable or unspecified: Secondary | ICD-10-CM | POA: Diagnosis not present

## 2021-08-25 DIAGNOSIS — Z3A37 37 weeks gestation of pregnancy: Secondary | ICD-10-CM

## 2021-08-25 DIAGNOSIS — O99213 Obesity complicating pregnancy, third trimester: Secondary | ICD-10-CM | POA: Diagnosis present

## 2021-08-25 DIAGNOSIS — O283 Abnormal ultrasonic finding on antenatal screening of mother: Secondary | ICD-10-CM | POA: Diagnosis present

## 2021-09-01 ENCOUNTER — Ambulatory Visit: Payer: BC Managed Care – PPO | Attending: Maternal & Fetal Medicine

## 2021-09-01 ENCOUNTER — Other Ambulatory Visit: Payer: Self-pay | Admitting: Obstetrics and Gynecology

## 2021-09-01 ENCOUNTER — Other Ambulatory Visit: Payer: Self-pay | Admitting: *Deleted

## 2021-09-01 ENCOUNTER — Ambulatory Visit: Payer: BC Managed Care – PPO | Admitting: *Deleted

## 2021-09-01 VITALS — BP 115/70 | HR 114

## 2021-09-01 DIAGNOSIS — Z3A38 38 weeks gestation of pregnancy: Secondary | ICD-10-CM | POA: Diagnosis not present

## 2021-09-01 DIAGNOSIS — O358XX0 Maternal care for other (suspected) fetal abnormality and damage, not applicable or unspecified: Secondary | ICD-10-CM | POA: Diagnosis not present

## 2021-09-01 DIAGNOSIS — O283 Abnormal ultrasonic finding on antenatal screening of mother: Secondary | ICD-10-CM | POA: Diagnosis present

## 2021-09-01 DIAGNOSIS — O99213 Obesity complicating pregnancy, third trimester: Secondary | ICD-10-CM | POA: Insufficient documentation

## 2021-09-01 DIAGNOSIS — E669 Obesity, unspecified: Secondary | ICD-10-CM

## 2021-09-04 ENCOUNTER — Other Ambulatory Visit: Payer: Self-pay | Admitting: Obstetrics & Gynecology

## 2021-09-06 ENCOUNTER — Ambulatory Visit: Payer: BC Managed Care – PPO | Admitting: *Deleted

## 2021-09-06 ENCOUNTER — Telehealth (HOSPITAL_COMMUNITY): Payer: Self-pay | Admitting: *Deleted

## 2021-09-06 ENCOUNTER — Encounter: Payer: Self-pay | Admitting: *Deleted

## 2021-09-06 ENCOUNTER — Ambulatory Visit: Payer: BC Managed Care – PPO | Attending: Maternal & Fetal Medicine

## 2021-09-06 DIAGNOSIS — Z3A39 39 weeks gestation of pregnancy: Secondary | ICD-10-CM

## 2021-09-06 DIAGNOSIS — O35BXX0 Maternal care for other (suspected) fetal abnormality and damage, fetal cardiac anomalies, not applicable or unspecified: Secondary | ICD-10-CM

## 2021-09-06 DIAGNOSIS — E669 Obesity, unspecified: Secondary | ICD-10-CM | POA: Diagnosis not present

## 2021-09-06 DIAGNOSIS — O283 Abnormal ultrasonic finding on antenatal screening of mother: Secondary | ICD-10-CM | POA: Diagnosis present

## 2021-09-06 DIAGNOSIS — O99213 Obesity complicating pregnancy, third trimester: Secondary | ICD-10-CM | POA: Diagnosis present

## 2021-09-06 NOTE — Telephone Encounter (Signed)
Preadmission screen  

## 2021-09-07 ENCOUNTER — Encounter (HOSPITAL_COMMUNITY): Payer: Self-pay | Admitting: *Deleted

## 2021-09-08 ENCOUNTER — Inpatient Hospital Stay (HOSPITAL_COMMUNITY)
Admission: AD | Admit: 2021-09-08 | Discharge: 2021-09-08 | Disposition: A | Discharge status: Home / Self Care | Payer: BC Managed Care – PPO | Attending: Obstetrics & Gynecology | Admitting: Obstetrics & Gynecology

## 2021-09-08 ENCOUNTER — Encounter (HOSPITAL_COMMUNITY): Payer: Self-pay | Admitting: Obstetrics & Gynecology

## 2021-09-08 DIAGNOSIS — Z3A39 39 weeks gestation of pregnancy: Secondary | ICD-10-CM | POA: Insufficient documentation

## 2021-09-08 DIAGNOSIS — O368330 Maternal care for abnormalities of the fetal heart rate or rhythm, third trimester, not applicable or unspecified: Secondary | ICD-10-CM | POA: Diagnosis present

## 2021-09-08 DIAGNOSIS — Z3689 Encounter for other specified antenatal screening: Secondary | ICD-10-CM | POA: Diagnosis not present

## 2021-09-08 HISTORY — DX: Vitiligo: L80

## 2021-09-08 NOTE — MAU Provider Note (Signed)
History     CSN: [id number]  Arrival date and time: 09/08/21 1256   Event Date/Time   First Provider Initiated Contact with Patient 09/08/21 1347      No chief complaint on file.  HPI Patient Jodi Downs is a 26 y.o. G1P0  At [redacted]w[redacted]d here after having nonreactive NST at her appt today at [facility]. Per report, she had NRNST with variables.She denies vaginal bleeding, contractions, LOF. She endorses strong fetal movements. She reports that she had not eaten before her appt today and that as of now (1356), she had only eaten crackers and juice thus far.  OB History     Gravida  1   Para      Term      Preterm      AB      Living         SAB      IAB      Ectopic      Multiple      Live Births              Past Medical History:  Diagnosis Date   Anxiety    Depression    Frequent headaches    Migraines    Vitamin D deficiency    Vitiligo     Past Surgical History:  Procedure Laterality Date   MOUTH SURGERY Bilateral 2019   wisdom teeth removal , tooth extraction w/ bone graph 2019   TONSILLECTOMY AND ADENOIDECTOMY  2000   WISDOM TOOTH EXTRACTION      Family History  Problem Relation Age of Onset   Asthma Mother    Hyperlipidemia Mother    Hypertension Mother    Heart disease Father    Hyperlipidemia Father    Hypertension Father    Alcohol abuse Father    Learning disabilities Brother    Intellectual disability Brother    Diabetes Maternal Aunt    Diabetes Maternal Grandmother    Stroke Maternal Grandmother    Diabetes Paternal Grandfather    Cancer Neg Hx     Social History   Tobacco Use   Smoking status: Never   Smokeless tobacco: Never  Vaping Use   Vaping Use: Never used  Substance Use Topics   Alcohol use: Not Currently    Comment: social   Drug use: No    Allergies: No Known Allergies  Medications Prior to Admission  Medication Sig Dispense Refill Last Dose   diphenhydrAMINE (BENADRYL) 25 MG tablet Take 25 mg by mouth  every 6 (six) hours as needed for allergies (dog).   09/07/2021   Prenatal Vit-Fe Fumarate-FA (PRENATAL MULTIVITAMIN) TABS tablet Take 1 tablet by mouth daily at 12 noon.   09/08/2021    Review of Systems  Constitutional: Negative.   HENT: Negative.    Respiratory: Negative.    Cardiovascular: Negative.   Gastrointestinal: Negative.   Genitourinary: Negative.   Musculoskeletal: Negative.   Neurological: Negative.    Physical Exam   Blood pressure 137/81, pulse (!) 109, temperature 98.2 F (36.8 C), temperature source Oral, resp. rate 20, height 5' 4.5" (1.638 m), weight 125.7 kg, last menstrual period 12/14/2020, SpO2 99 %.  Physical Exam Constitutional:      Appearance: Normal appearance.  Cardiovascular:     Rate and Rhythm: Normal rate.  Pulmonary:     Effort: Pulmonary effort is normal.  Abdominal:     General: Abdomen is flat.  Skin:    General: Skin is warm.  Neurological:  General: No focal deficit present.     Mental Status: She is alert and oriented to person, place, and time.  Psychiatric:        Mood and Affect: Mood normal.        Behavior: Behavior normal.     MAU Course  Procedures  MDM -patient had not eaten other than crackers and juice today; she was given more snacks in MAU and felt strong fetal movements; audible kicks heard. Due to fetal movements, it was difficult to trace baby but tracing that was achieved was very reassuring.   -NST: 135 bpm, mod var, present acel, no decels, no contractions Assessment and Plan   1. NST (non-stress test) reactive   -patient given information on kick counts, she verbalized understanding of when to return to MAU (stressed low threshold for returning to MAU if any concern for baby's movements) -other return precautions reviewed   Charlesetta Garibaldi Jodi Downs 09/08/2021, 1:51 PM

## 2021-09-08 NOTE — MAU Note (Addendum)
Jodi Downs is a 26 y.o. at [redacted]w[redacted]d here in MAU reporting: office had called, pt for induction at 40wks  (BMI).  Non-reactive FH today in office.  Here for further monitoring. No bleeding or leaking. Denies pain. Does feel baby move.  Onset of complaint: today Pain score: none Vitals:   09/08/21 1319  BP: 137/81  Pulse: (!) 109  Resp: 20  Temp: 98.2 F (36.8 C)  SpO2: 99%     FHT:162 Lab orders placed from triage:  none

## 2021-09-11 LAB — OB RESULTS CONSOLE GC/CHLAMYDIA
Chlamydia: NEGATIVE
Neisseria Gonorrhea: NEGATIVE

## 2021-09-13 ENCOUNTER — Inpatient Hospital Stay (HOSPITAL_COMMUNITY)
Admission: AD | Admit: 2021-09-13 | Discharge: 2021-09-17 | DRG: 787 | Disposition: A | Discharge status: Home / Self Care | Payer: BC Managed Care – PPO | Attending: Obstetrics & Gynecology | Admitting: Obstetrics & Gynecology

## 2021-09-13 ENCOUNTER — Other Ambulatory Visit: Payer: Self-pay

## 2021-09-13 ENCOUNTER — Inpatient Hospital Stay (HOSPITAL_COMMUNITY): Payer: BC Managed Care – PPO

## 2021-09-13 ENCOUNTER — Encounter (HOSPITAL_COMMUNITY): Payer: Self-pay | Admitting: Obstetrics & Gynecology

## 2021-09-13 DIAGNOSIS — Z3A40 40 weeks gestation of pregnancy: Secondary | ICD-10-CM

## 2021-09-13 DIAGNOSIS — O9081 Anemia of the puerperium: Secondary | ICD-10-CM | POA: Diagnosis not present

## 2021-09-13 DIAGNOSIS — Z419 Encounter for procedure for purposes other than remedying health state, unspecified: Secondary | ICD-10-CM | POA: Diagnosis not present

## 2021-09-13 DIAGNOSIS — D62 Acute posthemorrhagic anemia: Secondary | ICD-10-CM | POA: Diagnosis not present

## 2021-09-13 DIAGNOSIS — O99214 Obesity complicating childbirth: Principal | ICD-10-CM | POA: Diagnosis present

## 2021-09-13 DIAGNOSIS — O358XX0 Maternal care for other (suspected) fetal abnormality and damage, not applicable or unspecified: Principal | ICD-10-CM | POA: Diagnosis present

## 2021-09-13 DIAGNOSIS — Z98891 History of uterine scar from previous surgery: Secondary | ICD-10-CM

## 2021-09-13 DIAGNOSIS — B081 Molluscum contagiosum: Secondary | ICD-10-CM

## 2021-09-13 LAB — CBC
HCT: 33.1 % — ABNORMAL LOW (ref 36.0–46.0)
Hemoglobin: 11.2 g/dL — ABNORMAL LOW (ref 12.0–15.0)
MCH: 28.7 pg (ref 26.0–34.0)
MCHC: 33.8 g/dL (ref 30.0–36.0)
MCV: 84.9 fL (ref 80.0–100.0)
Platelets: 244 10*3/uL (ref 150–400)
RBC: 3.9 MIL/uL (ref 3.87–5.11)
RDW: 14.5 % (ref 11.5–15.5)
WBC: 7 10*3/uL (ref 4.0–10.5)
nRBC: 0 % (ref 0.0–0.2)

## 2021-09-13 LAB — TYPE AND SCREEN
ABO/RH(D): O POS
Antibody Screen: NEGATIVE

## 2021-09-13 MED ORDER — MISOPROSTOL 25 MCG QUARTER TABLET
25.0000 ug | ORAL_TABLET | ORAL | Status: DC
  Administered 2021-09-13: 25 ug via VAGINAL
  Filled 2021-09-13: qty 1

## 2021-09-13 MED ORDER — TERBUTALINE SULFATE 1 MG/ML IJ SOLN: 0.2500 mg | Freq: Once | INTRAMUSCULAR | Status: DC | PRN

## 2021-09-13 MED ORDER — SOD CITRATE-CITRIC ACID 500-334 MG/5ML PO SOLN
30.0000 mL | ORAL | Status: DC | PRN
  Filled 2021-09-13 (×2): qty 30

## 2021-09-13 MED ORDER — LIDOCAINE HCL (PF) 1 % IJ SOLN: 30.0000 mL | INTRAMUSCULAR | Status: DC | PRN

## 2021-09-13 MED ORDER — OXYTOCIN-SODIUM CHLORIDE 30-0.9 UT/500ML-% IV SOLN: 2.5000 [IU]/h | INTRAVENOUS | Status: DC

## 2021-09-13 MED ORDER — LACTATED RINGERS IV SOLN: INTRAVENOUS | Status: DC

## 2021-09-13 MED ORDER — MISOPROSTOL 25 MCG QUARTER TABLET
25.0000 ug | ORAL_TABLET | ORAL | Status: DC
  Administered 2021-09-13: 25 ug via ORAL
  Filled 2021-09-13: qty 1

## 2021-09-13 MED ORDER — OXYTOCIN BOLUS FROM INFUSION: 333.0000 mL | Freq: Once | INTRAVENOUS | Status: DC

## 2021-09-13 MED ORDER — ACETAMINOPHEN 325 MG PO TABS: 650.0000 mg | ORAL_TABLET | ORAL | Status: DC | PRN

## 2021-09-13 MED ORDER — ONDANSETRON HCL 4 MG/2ML IJ SOLN: 4.0000 mg | Freq: Four times a day (QID) | INTRAMUSCULAR | Status: DC | PRN

## 2021-09-13 MED ORDER — OXYTOCIN-SODIUM CHLORIDE 30-0.9 UT/500ML-% IV SOLN: 1.0000 m[IU]/min | INTRAVENOUS | Status: DC

## 2021-09-13 MED ORDER — TERBUTALINE SULFATE 1 MG/ML IJ SOLN
0.2500 mg | Freq: Once | INTRAMUSCULAR | Status: DC | PRN
  Filled 2021-09-13: qty 1

## 2021-09-13 MED ORDER — LACTATED RINGERS IV SOLN
500.0000 mL | INTRAVENOUS | Status: DC | PRN
  Administered 2021-09-13 – 2021-09-14 (×2): 500 mL via INTRAVENOUS

## 2021-09-13 NOTE — H&P (Addendum)
Jodi Downs is a 26 y.o. female presenting for induction of labor for high BMI.  G1P0 at 40 weeks with LMP 12/14/20 and EDD 09/13/21 by first trimester U/S.   Prenatal course significant for: Persistent intrahepatic right umbilical vein noted on anatomy and subsequent fetal ultrasounds.  BMI at new OB visit in first trimester was 48.  Elevated 1 hr GTT, normal 3 hr GTT.   OB History     Gravida  1   Para      Term      Preterm      AB      Living         SAB      IAB      Ectopic      Multiple      Live Births             Past Medical History:  Diagnosis Date   Anxiety    Depression    Frequent headaches    Migraines    Vitamin D deficiency    Vitiligo    Past Surgical History:  Procedure Laterality Date   MOUTH SURGERY Bilateral 2019   wisdom teeth removal , tooth extraction w/ bone graph 2019   TONSILLECTOMY AND ADENOIDECTOMY  2000   WISDOM TOOTH EXTRACTION     Family History: family history includes Alcohol abuse in her father; Asthma in her mother; Diabetes in her maternal aunt, maternal grandmother, and paternal grandfather; Heart disease in her father; Hyperlipidemia in her father and mother; Hypertension in her father and mother; Intellectual disability in her brother; Learning disabilities in her brother; Stroke in her maternal grandmother. Social History:  reports that she has never smoked. She has never used smokeless tobacco. She reports that she does not currently use alcohol. She reports that she does not use drugs.     Maternal Diabetes: No Genetic Screening: Normal Maternal Ultrasounds/Referrals: Other:  09/01/21: EFW 7lbs 7 oz (57th%), normal AFI.  Fetal Ultrasounds or other Referrals:  None Maternal Substance Abuse:  No Significant Maternal Medications:  None Significant Maternal Lab Results:  Group B Strep negative Number of Prenatal Visits:greater than 3 verified prenatal visits Other Comments:  None  Review of  Systems History ROS  Constitutional: Denies fevers/chills Cardiovascular: Denies chest pain or palpitations Pulmonary: Denies coughing or wheezing Gastrointestinal: Denies nausea, vomiting or diarrhea Genitourinary: Denies pelvic pain, unusual vaginal bleeding, unusual vaginal discharge, dysuria, urgency or frequency.  Musculoskeletal: Denies muscle or joint aches and pain.  Neurology: Denies abnormal sensations such as tingling or numbness.   Dilation: Fingertip Effacement (%): Thick Station: -3 Exam by:: Jodi Mallory, RN Blood pressure 115/79, pulse (!) 106, temperature 98.3 F (36.8 C), temperature source Oral, resp. rate 16, height 5\' 4"  (1.626 m), weight 126.2 kg, last menstrual period 12/14/2020. Exam Physical Exam  Constitutional: She is oriented to person, place, and time. She appears well-developed and well-nourished.  HENT:  Head: Normocephalic and atraumatic.  Neck: Normal range of motion.  Cardiovascular: Normal rate.    Respiratory: Effort normal.   GI: Soft.  Genitorurinary: Gravid uterus, appropriate for gestational age.   Neurological: She is alert and oriented to person, place, and time.  Skin: Skin is warm and dry.  Psychiatric: She has a normal mood and affect. Her behavior is normal. EFM:160 baseline, moderate variability,  reactive TOCO: Irregular every 10  minutes  CVX: 0.5 cm/thick/-3 per RN 12/16/2020, Jodi Downs.  Prenatal labs: ABO, Rh: --/--/PENDING (08/30 2225) Antibody: PENDING (08/30 2225)  Rubella: Immune (02/03 0000) RPR: Nonreactive (02/03 0000)  HBsAg: Negative (02/03 0000)  HIV: Non-reactive (02/03 0000)  GBS: Negative/-- (08/04 0000)   Assessment/Plan: 26 y/o G1P0 here for induction of labor for high BMI,  - Admit to Labor and Delivery. - Plan is to start cervical ripening with concurrent buccal and vaginal cytotec. Induction plan has previously been discussed with the patient at office who agreed with the plan.    Jodi Sours, MD.   09/13/2021, 11:23 PM

## 2021-09-14 LAB — RPR: RPR Ser Ql: NONREACTIVE

## 2021-09-14 MED ORDER — HYDROXYZINE HCL 25 MG PO TABS
25.0000 mg | ORAL_TABLET | Freq: Three times a day (TID) | ORAL | Status: DC | PRN
  Administered 2021-09-14 – 2021-09-16 (×4): 25 mg via ORAL
  Filled 2021-09-14 (×4): qty 1

## 2021-09-14 MED ORDER — OXYTOCIN-SODIUM CHLORIDE 30-0.9 UT/500ML-% IV SOLN
1.0000 m[IU]/min | INTRAVENOUS | Status: DC
  Administered 2021-09-14: 1 m[IU]/min via INTRAVENOUS
  Administered 2021-09-15: 4 m[IU]/min via INTRAVENOUS
  Filled 2021-09-14: qty 500

## 2021-09-14 NOTE — Progress Notes (Signed)
Jodi Downs is a 26 y.o. G1P0 at [redacted]w[redacted]d  Subjective: No complaints.  Not feeling ctxs.  Objective: BP (!) 117/54   Pulse 96   Temp 98.2 F (36.8 C) (Oral)   Resp 16   Ht 5\' 4"  (1.626 m)   Wt 126.2 kg   LMP 12/14/2020   BMI 47.77 kg/m  No intake/output data recorded. No intake/output data recorded.  FHT:  FHR: 135 bpm, variability: moderate,  accelerations:  Present,  decelerations:  Absent UC:   regular, every 3-4 minutes SVE:   Dilation: Fingertip Effacement (%): Thick Station: -3 Exam by:: Dr. [email or web address]  Labs: Lab Results  Component Value Date   WBC 7.0 09/13/2021   HGB 11.2 (L) 09/13/2021   HCT 33.1 (L) 09/13/2021   MCV 84.9 09/13/2021   PLT 244 09/13/2021    Assessment / Plan: IOL  Labor:  Titrate pitocin as indicated.  S/p placement of foley balloon at about 1300. Preeclampsia:  no signs or symptoms of toxicity Fetal Wellbeing:  Category I Pain Control:   pain med upon request I/D:   GBS neg Anticipated MOD:  NSVD  09/15/2021, MD 09/14/2021, 1:31 PM

## 2021-09-14 NOTE — Progress Notes (Signed)
   Subjective:  Patient feels mild contractions intermittently . Reports they have awaken her. She request to postpone rupture of membranes. +FM no lof.  She is keeping options for pain control open.   Objective: BP 118/83   Pulse (!) 108   Temp 98.3 F (36.8 C) (Oral)   Resp 18   Ht 5\' 4"  (1.626 m)   Wt 126.2 kg   LMP 12/14/2020   BMI 47.77 kg/m  No intake/output data recorded. No intake/output data recorded.  FHT:  FHR: 140 bpm, variability: moderate,  accelerations:  Present,  decelerations:  Absent UC:   regular, every 1-2 minutes SVE:   Dilation: 3.5 Effacement (%): 70, 80 Station: -3 Exam by:: [email or web address], MD  Labs: Lab Results  Component Value Date   WBC 7.0 09/13/2021   HGB 11.2 (L) 09/13/2021   HCT 33.1 (L) 09/13/2021   MCV 84.9 09/13/2021   PLT 244 09/13/2021    Assessment / Plan: Induction of labor due to morbid obesity   Labor:  continue to increase pitocin as long as tolerated by fetus.. plan AROM with fetal descent.  Preeclampsia:   NA Fetal Wellbeing:  Category I Pain Control:  Labor support without medications I/D:  n/a Anticipated MOD:  NSVD  09/15/2021, MD 09/14/2021, 11:14 PM

## 2021-09-14 NOTE — Progress Notes (Signed)
Jodi Downs is a 26 y.o. G1P0 at [redacted]w[redacted]d   Subjective: No complaints.  Wanted something for anxiety.  Declined pain medication.  Objective: BP 120/67   Pulse (!) 105   Temp 98.2 F (36.8 C) (Oral)   Resp 16   Ht 5\' 4"  (1.626 m)   Wt 126.2 kg   LMP 12/14/2020   BMI 47.77 kg/m  No intake/output data recorded. No intake/output data recorded.  FHT:  FHR: 135 bpm, variability: moderate,  accelerations:  Present,  decelerations:  Absent except occas variable UC:   regular, every 2-3 minutes SVE:   Dilation: 3 (difficult exam d/t pt crawling up the bed she states d/t anxiety and cervix is so posterior) Effacement (%): Thick Station: -3 Exam by:: Dr. [email or web address]  Labs: Lab Results  Component Value Date   WBC 7.0 09/13/2021   HGB 11.2 (L) 09/13/2021   HCT 33.1 (L) 09/13/2021   MCV 84.9 09/13/2021   PLT 244 09/13/2021    Assessment / Plan: Induction of labor due to BMI,  progressing well on pitocin s/p foley balloon placement at 1230 and came out at 1530  Labor:  cont to titrate pitocin as indicated Preeclampsia:  no signs or symptoms of toxicity Fetal Wellbeing:  Category I occas cat 2 episodes Pain Control:   pain medication upon request I/D:   GBS neg Anticipated MOD:  NSVD  09/15/2021, MD 09/14/2021, 3:18 PM

## 2021-09-15 ENCOUNTER — Encounter (HOSPITAL_COMMUNITY)
Admission: AD | Disposition: A | Discharge status: Home / Self Care | Payer: Self-pay | Source: Home / Self Care | Attending: Obstetrics & Gynecology

## 2021-09-15 ENCOUNTER — Encounter (HOSPITAL_COMMUNITY): Payer: Self-pay | Admitting: Obstetrics & Gynecology

## 2021-09-15 ENCOUNTER — Inpatient Hospital Stay (HOSPITAL_COMMUNITY): Payer: BC Managed Care – PPO | Admitting: Anesthesiology

## 2021-09-15 ENCOUNTER — Other Ambulatory Visit: Payer: Self-pay

## 2021-09-15 SURGERY — Surgical Case
Anesthesia: Epidural

## 2021-09-15 MED ORDER — LACTATED RINGERS IV BOLUS
500.0000 mL | Freq: Once | INTRAVENOUS | Status: AC
Start: 2021-09-15 — End: 2021-09-15
  Administered 2021-09-15: 500 mL via INTRAVENOUS

## 2021-09-15 MED ORDER — FENTANYL CITRATE (PF) 100 MCG/2ML IJ SOLN
INTRAMUSCULAR | Status: DC | PRN
Start: 2021-09-15 — End: 2021-09-15
  Administered 2021-09-15: 100 ug via INTRAVENOUS

## 2021-09-15 MED ORDER — LACTATED RINGERS AMNIOINFUSION: INTRAVENOUS | Status: DC

## 2021-09-15 MED ORDER — DEXAMETHASONE SODIUM PHOSPHATE 4 MG/ML IJ SOLN
INTRAMUSCULAR | Status: DC | PRN
  Administered 2021-09-15: 10 mg via INTRAVENOUS

## 2021-09-15 MED ORDER — ALBUMIN HUMAN 5 % IV SOLN: INTRAVENOUS | Status: DC | PRN

## 2021-09-15 MED ORDER — EPHEDRINE 5 MG/ML INJ: 10.0000 mg | INTRAVENOUS | Status: DC | PRN

## 2021-09-15 MED ORDER — WITCH HAZEL-GLYCERIN EX PADS: 1.0000 | MEDICATED_PAD | CUTANEOUS | Status: DC | PRN

## 2021-09-15 MED ORDER — DEXTROSE 5 % IV SOLN
INTRAVENOUS | Status: DC | PRN
  Administered 2021-09-15: 3 g via INTRAVENOUS

## 2021-09-15 MED ORDER — COCONUT OIL OIL: 1.0000 | TOPICAL_OIL | Status: DC | PRN

## 2021-09-15 MED ORDER — TRANEXAMIC ACID-NACL 1000-0.7 MG/100ML-% IV SOLN
INTRAVENOUS | Status: AC
  Filled 2021-09-15: qty 100

## 2021-09-15 MED ORDER — FENTANYL CITRATE (PF) 100 MCG/2ML IJ SOLN
INTRAMUSCULAR | Status: AC
  Filled 2021-09-15: qty 2

## 2021-09-15 MED ORDER — SIMETHICONE 80 MG PO CHEW
80.0000 mg | CHEWABLE_TABLET | Freq: Three times a day (TID) | ORAL | Status: DC
  Administered 2021-09-16 – 2021-09-17 (×4): 80 mg via ORAL
  Filled 2021-09-15 (×4): qty 1

## 2021-09-15 MED ORDER — LIDOCAINE HCL (PF) 1 % IJ SOLN
INTRAMUSCULAR | Status: DC | PRN
  Administered 2021-09-15 (×2): 4 mL via EPIDURAL

## 2021-09-15 MED ORDER — LACTATED RINGERS IV SOLN: 500.0000 mL | Freq: Once | INTRAVENOUS | Status: DC

## 2021-09-15 MED ORDER — MORPHINE SULFATE (PF) 0.5 MG/ML IJ SOLN
INTRAMUSCULAR | Status: DC | PRN
  Administered 2021-09-15: 3 mg via EPIDURAL

## 2021-09-15 MED ORDER — FENTANYL CITRATE (PF) 100 MCG/2ML IJ SOLN: 25.0000 ug | INTRAMUSCULAR | Status: DC | PRN

## 2021-09-15 MED ORDER — FENTANYL-BUPIVACAINE-NACL 0.5-0.125-0.9 MG/250ML-% EP SOLN
12.0000 mL/h | EPIDURAL | Status: DC | PRN
  Administered 2021-09-15: 12 mL/h via EPIDURAL
  Filled 2021-09-15: qty 250

## 2021-09-15 MED ORDER — PHENYLEPHRINE 80 MCG/ML (10ML) SYRINGE FOR IV PUSH (FOR BLOOD PRESSURE SUPPORT)
80.0000 ug | PREFILLED_SYRINGE | INTRAVENOUS | Status: DC | PRN
  Administered 2021-09-15: 80 ug via INTRAVENOUS
  Filled 2021-09-15: qty 10

## 2021-09-15 MED ORDER — SIMETHICONE 80 MG PO CHEW: 80.0000 mg | CHEWABLE_TABLET | ORAL | Status: DC | PRN

## 2021-09-15 MED ORDER — SCOPOLAMINE 1 MG/3DAYS TD PT72
1.0000 | MEDICATED_PATCH | TRANSDERMAL | Status: DC
  Administered 2021-09-15: 1.5 mg via TRANSDERMAL
  Filled 2021-09-15: qty 1

## 2021-09-15 MED ORDER — OXYTOCIN-SODIUM CHLORIDE 30-0.9 UT/500ML-% IV SOLN
INTRAVENOUS | Status: AC
  Filled 2021-09-15: qty 500

## 2021-09-15 MED ORDER — ONDANSETRON HCL 4 MG/2ML IJ SOLN
4.0000 mg | Freq: Four times a day (QID) | INTRAMUSCULAR | Status: DC | PRN
Start: 2021-09-15 — End: 2021-09-17
  Administered 2021-09-15: 4 mg via INTRAVENOUS
  Filled 2021-09-15: qty 2

## 2021-09-15 MED ORDER — ACETAMINOPHEN 500 MG PO TABS
1000.0000 mg | ORAL_TABLET | Freq: Four times a day (QID) | ORAL | Status: DC
  Administered 2021-09-16 – 2021-09-17 (×6): 1000 mg via ORAL
  Filled 2021-09-15 (×6): qty 2

## 2021-09-15 MED ORDER — TETANUS-DIPHTH-ACELL PERTUSSIS 5-2.5-18.5 LF-MCG/0.5 IM SUSY: 0.5000 mL | PREFILLED_SYRINGE | Freq: Once | INTRAMUSCULAR | Status: DC

## 2021-09-15 MED ORDER — PHENYLEPHRINE HCL-NACL 20-0.9 MG/250ML-% IV SOLN
INTRAVENOUS | Status: DC | PRN
  Administered 2021-09-15: 60 ug/min via INTRAVENOUS

## 2021-09-15 MED ORDER — STERILE WATER FOR IRRIGATION IR SOLN
Status: DC | PRN
  Administered 2021-09-15: 1000 mL

## 2021-09-15 MED ORDER — TRANEXAMIC ACID-NACL 1000-0.7 MG/100ML-% IV SOLN
INTRAVENOUS | Status: DC | PRN
  Administered 2021-09-15: 1000 mg via INTRAVENOUS

## 2021-09-15 MED ORDER — DIBUCAINE (PERIANAL) 1 % EX OINT: 1.0000 | TOPICAL_OINTMENT | CUTANEOUS | Status: DC | PRN

## 2021-09-15 MED ORDER — LACTATED RINGERS IV BOLUS
1000.0000 mL | Freq: Once | INTRAVENOUS | Status: DC
Start: 2021-09-15 — End: 2021-09-15

## 2021-09-15 MED ORDER — PRENATAL MULTIVITAMIN CH
1.0000 | ORAL_TABLET | Freq: Every day | ORAL | Status: DC
  Administered 2021-09-16 – 2021-09-17 (×2): 1 via ORAL
  Filled 2021-09-15 (×2): qty 1

## 2021-09-15 MED ORDER — FENTANYL CITRATE (PF) 100 MCG/2ML IJ SOLN
INTRAMUSCULAR | Status: DC | PRN
  Administered 2021-09-15: 100 ug via EPIDURAL

## 2021-09-15 MED ORDER — OXYCODONE-ACETAMINOPHEN 5-325 MG PO TABS: 1.0000 | ORAL_TABLET | ORAL | Status: DC | PRN

## 2021-09-15 MED ORDER — LACTATED RINGERS IV SOLN: INTRAVENOUS | Status: DC

## 2021-09-15 MED ORDER — DEXMEDETOMIDINE HCL IN NACL 80 MCG/20ML IV SOLN
INTRAVENOUS | Status: AC
  Filled 2021-09-15: qty 20

## 2021-09-15 MED ORDER — IBUPROFEN 600 MG PO TABS
600.0000 mg | ORAL_TABLET | Freq: Four times a day (QID) | ORAL | Status: DC
  Administered 2021-09-16 – 2021-09-17 (×6): 600 mg via ORAL
  Filled 2021-09-15 (×6): qty 1

## 2021-09-15 MED ORDER — PHENYLEPHRINE 80 MCG/ML (10ML) SYRINGE FOR IV PUSH (FOR BLOOD PRESSURE SUPPORT)
80.0000 ug | PREFILLED_SYRINGE | INTRAVENOUS | Status: DC | PRN
  Administered 2021-09-15: 80 ug via INTRAVENOUS

## 2021-09-15 MED ORDER — DIPHENHYDRAMINE HCL 25 MG PO CAPS: 25.0000 mg | ORAL_CAPSULE | Freq: Four times a day (QID) | ORAL | Status: DC | PRN

## 2021-09-15 MED ORDER — ALBUMIN HUMAN 5 % IV SOLN
INTRAVENOUS | Status: AC
  Filled 2021-09-15: qty 250

## 2021-09-15 MED ORDER — OXYTOCIN-SODIUM CHLORIDE 30-0.9 UT/500ML-% IV SOLN: 1.0000 m[IU]/min | INTRAVENOUS | Status: DC

## 2021-09-15 MED ORDER — SENNOSIDES-DOCUSATE SODIUM 8.6-50 MG PO TABS
2.0000 | ORAL_TABLET | Freq: Every day | ORAL | Status: DC
  Administered 2021-09-16 – 2021-09-17 (×2): 2 via ORAL
  Filled 2021-09-15 (×2): qty 2

## 2021-09-15 MED ORDER — OXYTOCIN-SODIUM CHLORIDE 30-0.9 UT/500ML-% IV SOLN
INTRAVENOUS | Status: DC | PRN
  Administered 2021-09-15: 300 mL via INTRAVENOUS

## 2021-09-15 MED ORDER — HYDROMORPHONE HCL 1 MG/ML IJ SOLN: 0.2000 mg | INTRAMUSCULAR | Status: DC | PRN

## 2021-09-15 MED ORDER — CEFAZOLIN IN SODIUM CHLORIDE 3-0.9 GM/100ML-% IV SOLN
INTRAVENOUS | Status: AC
  Filled 2021-09-15: qty 100

## 2021-09-15 MED ORDER — ONDANSETRON HCL 4 MG/2ML IJ SOLN
INTRAMUSCULAR | Status: DC | PRN
  Administered 2021-09-15: 4 mg via INTRAVENOUS

## 2021-09-15 MED ORDER — PHENYLEPHRINE HCL (PRESSORS) 10 MG/ML IV SOLN
INTRAVENOUS | Status: DC | PRN
  Administered 2021-09-15 (×2): 160 ug via INTRAVENOUS
  Administered 2021-09-15 (×2): 80 ug via INTRAVENOUS

## 2021-09-15 MED ORDER — ACETAMINOPHEN 10 MG/ML IV SOLN: 1000.0000 mg | Freq: Once | INTRAVENOUS | Status: DC | PRN

## 2021-09-15 MED ORDER — ENOXAPARIN SODIUM 60 MG/0.6ML IJ SOSY
60.0000 mg | PREFILLED_SYRINGE | INTRAMUSCULAR | Status: DC
  Administered 2021-09-16 – 2021-09-17 (×2): 60 mg via SUBCUTANEOUS
  Filled 2021-09-15 (×2): qty 0.6

## 2021-09-15 MED ORDER — DEXMEDETOMIDINE (PRECEDEX) IN NS 20 MCG/5ML (4 MCG/ML) IV SYRINGE
PREFILLED_SYRINGE | INTRAVENOUS | Status: DC | PRN
  Administered 2021-09-15: 12 ug via INTRAVENOUS

## 2021-09-15 MED ORDER — TERBUTALINE SULFATE 1 MG/ML IJ SOLN
0.2500 mg | Freq: Once | INTRAMUSCULAR | Status: AC | PRN
  Administered 2021-09-15: 0.25 mg via SUBCUTANEOUS

## 2021-09-15 MED ORDER — MORPHINE SULFATE (PF) 0.5 MG/ML IJ SOLN
INTRAMUSCULAR | Status: AC
  Filled 2021-09-15: qty 10

## 2021-09-15 MED ORDER — OXYTOCIN-SODIUM CHLORIDE 30-0.9 UT/500ML-% IV SOLN: 2.5000 [IU]/h | INTRAVENOUS | Status: AC

## 2021-09-15 MED ORDER — DIPHENHYDRAMINE HCL 50 MG/ML IJ SOLN: 12.5000 mg | INTRAMUSCULAR | Status: DC | PRN

## 2021-09-15 MED ORDER — MENTHOL 3 MG MT LOZG: 1.0000 | LOZENGE | OROMUCOSAL | Status: DC | PRN

## 2021-09-15 MED ORDER — LIDOCAINE-EPINEPHRINE (PF) 2 %-1:200000 IJ SOLN
INTRAMUSCULAR | Status: DC | PRN
  Administered 2021-09-15: 15 mL via EPIDURAL
  Administered 2021-09-15: 5 mL via EPIDURAL

## 2021-09-15 MED ORDER — FENTANYL CITRATE (PF) 100 MCG/2ML IJ SOLN
50.0000 ug | INTRAMUSCULAR | Status: DC | PRN
  Administered 2021-09-15: 50 ug via INTRAVENOUS
  Filled 2021-09-15: qty 2

## 2021-09-15 MED ORDER — LACTATED RINGERS IV SOLN: INTRAVENOUS | Status: DC | PRN

## 2021-09-15 MED ORDER — ONDANSETRON HCL 4 MG/2ML IJ SOLN
INTRAMUSCULAR | Status: AC
  Filled 2021-09-15: qty 2

## 2021-09-15 SURGICAL SUPPLY — 47 items
BENZOIN TINCTURE PRP APPL 2/3 (GAUZE/BANDAGES/DRESSINGS) IMPLANT
CANISTER PREVENA PLUS 150 (CANNISTER) IMPLANT
CHLORAPREP W/TINT 26ML (MISCELLANEOUS) ×2 IMPLANT
CLAMP CORD UMBIL (MISCELLANEOUS) ×1 IMPLANT
CLOTH BEACON ORANGE TIMEOUT ST (SAFETY) ×1 IMPLANT
DERMABOND ADVANCED (GAUZE/BANDAGES/DRESSINGS)
DERMABOND ADVANCED .7 DNX12 (GAUZE/BANDAGES/DRESSINGS) IMPLANT
DRAPE C SECTION CLR SCREEN (DRAPES) ×1 IMPLANT
DRESSING PREVENA PLUS CUSTOM (GAUZE/BANDAGES/DRESSINGS) IMPLANT
DRSG OPSITE POSTOP 4X10 (GAUZE/BANDAGES/DRESSINGS) ×1 IMPLANT
DRSG PREVENA PLUS CUSTOM (GAUZE/BANDAGES/DRESSINGS) ×1
ELECT REM PT RETURN 9FT ADLT (ELECTROSURGICAL) ×1
ELECTRODE REM PT RTRN 9FT ADLT (ELECTROSURGICAL) ×1 IMPLANT
EXTRACTOR VACUUM M CUP 4 TUBE (SUCTIONS) IMPLANT
GAUZE SPONGE 4X4 12PLY STRL LF (GAUZE/BANDAGES/DRESSINGS) IMPLANT
GLOVE BIOGEL PI IND STRL 7.0 (GLOVE) ×2 IMPLANT
GLOVE BIOGEL PI INDICATOR 7.0 (GLOVE) ×2
GLOVE SURG SS PI 6.5 STRL IVOR (GLOVE) ×1 IMPLANT
GOWN STRL REUS W/TWL LRG LVL3 (GOWN DISPOSABLE) ×2 IMPLANT
KIT ABG SYR 3ML LUER SLIP (SYRINGE) IMPLANT
MAT PREVALON FULL STRYKER (MISCELLANEOUS) IMPLANT
NDL HYPO 25X5/8 SAFETYGLIDE (NEEDLE) IMPLANT
NEEDLE HYPO 25X5/8 SAFETYGLIDE (NEEDLE) IMPLANT
NS IRRIG 1000ML POUR BTL (IV SOLUTION) ×1 IMPLANT
PACK C SECTION WH (CUSTOM PROCEDURE TRAY) ×1 IMPLANT
PAD ABD 7.5X8 STRL (GAUZE/BANDAGES/DRESSINGS) IMPLANT
PAD OB MATERNITY 4.3X12.25 (PERSONAL CARE ITEMS) ×1 IMPLANT
RTRCTR C-SECT PINK 25CM LRG (MISCELLANEOUS) ×1 IMPLANT
STRIP CLOSURE SKIN 1/2X4 (GAUZE/BANDAGES/DRESSINGS) IMPLANT
SUT CHROMIC 1 CTX 36 (SUTURE) IMPLANT
SUT CHROMIC 2 0 CT 1 (SUTURE) ×1 IMPLANT
SUT MNCRL 0 VIOLET CTX 36 (SUTURE) IMPLANT
SUT MON AB 4-0 PS1 27 (SUTURE) ×1 IMPLANT
SUT MONOCRYL 0 CTX 36 (SUTURE) ×1
SUT PDS AB 0 CTX 36 PDP370T (SUTURE) IMPLANT
SUT PLAIN 1 NONE 54 (SUTURE) IMPLANT
SUT PLAIN 2 0 (SUTURE)
SUT PLAIN 2 0 XLH (SUTURE) IMPLANT
SUT PLAIN ABS 2-0 CT1 27XMFL (SUTURE) IMPLANT
SUT VIC AB 0 CTX 36 (SUTURE) ×1
SUT VIC AB 0 CTX36XBRD ANBCTRL (SUTURE) ×1 IMPLANT
SUT VIC AB 1 CTX 36 (SUTURE) ×2
SUT VIC AB 1 CTX36XBRD ANBCTRL (SUTURE) ×2 IMPLANT
SUT VIC AB 2-0 CT1 (SUTURE) IMPLANT
TOWEL OR 17X24 6PK STRL BLUE (TOWEL DISPOSABLE) ×1 IMPLANT
TRAY FOLEY W/BAG SLVR 14FR LF (SET/KITS/TRAYS/PACK) ×1 IMPLANT
WATER STERILE IRR 1000ML POUR (IV SOLUTION) ×1 IMPLANT

## 2021-09-15 NOTE — Progress Notes (Addendum)
RN explained to patient that we could go ahead and restart pitocin, but that would require RN to perform SVE prior to starting. Patient remembered that RN was originally going to perform SVE at 0230 (4 hours from last SVE which was at 2225) and asked that we wait until 0230 to do SVE and restart pitocin. Patient settled and decreased in feelings of pain. VD RN

## 2021-09-15 NOTE — Progress Notes (Signed)
RN suggested performing SVE and restarting pitocin; patient refused and would like to reevaluate at 0300. Patient sleeping now. VD RN

## 2021-09-15 NOTE — Progress Notes (Signed)
RN suggested performing SVE and restarting pitocin as planned per last conversation with patient; patient refused again and would like to reevaluate at 0400. Patient sleeping now. VD RN

## 2021-09-15 NOTE — Progress Notes (Addendum)
Subjective:    Doing well, planning for an epidural to manage discomforts of labor. Discussed increasing Pitocin and pt agrees. Will consider AROM when labor is established.   Objective:    VS: BP (!) 112/54   Pulse (!) 106   Temp 98.3 F (36.8 C) (Oral)   Resp 14   Ht 5\' 4"  (1.626 m)   Wt 126.2 kg   LMP 12/14/2020   BMI 47.77 kg/m  FHR : baseline 150 / variability moderate / accelerations present / absent decelerations Toco: contractions every 3-5 minutes  Membranes: intact  Exam deferred due to pt's intolerance and no signs of changes in labor  Pitocin 6 mU/min  Assessment/Plan:   26 y.o. G1P0 [redacted]w[redacted]d IOL for maternal obesity  Labor: Progressing on Pitocin, will continue to increase then AROM will increase Pitocin by 2 mU/min Q 30 min Fetal Wellbeing:  Category I Pain Control:   planning epidural I/D:   GBS  Anticipated MOD:  NSVD  [redacted]w[redacted]d DNP, CNM 09/15/2021 8:01 AM

## 2021-09-15 NOTE — Transfer of Care (Signed)
Immediate Anesthesia Transfer of Care Note  Patient: Jodi Downs  Procedure(s) Performed: CESAREAN SECTION  Patient Location: PACU  Anesthesia Type:Epidural  Level of Consciousness: awake, alert  and oriented  Airway & Oxygen Therapy: Patient Spontanous Breathing  Post-op Assessment: Report given to RN and Post -op Vital signs reviewed and stable  Post vital signs: Reviewed and stable  Last Vitals:  Vitals Value Taken Time  BP 102/68 09/15/21 1851  Temp    Pulse 106 09/15/21 1855  Resp 18 09/15/21 1855  SpO2 98 % 09/15/21 1855  Vitals shown include unvalidated device data.  Last Pain:  Vitals:   09/15/21 1337  TempSrc: Oral  PainSc:          Complications: No notable events documented.

## 2021-09-15 NOTE — Progress Notes (Signed)
RN suggested performing SVE and restarting pitocin as planned per last conversation with patient; patient refused again, stating that "she just wants to take a nap", and would like to reevaluate in an hour. Patient sleeping now, not feeling any pain. VD RN

## 2021-09-15 NOTE — Anesthesia Preprocedure Evaluation (Signed)
Anesthesia Evaluation  Patient identified by MRN, date of birth, ID band Patient awake    Reviewed: Allergy & Precautions, Patient's Chart, lab work & pertinent test results  History of Anesthesia Complications Negative for: history of anesthetic complications  Airway Mallampati: II  TM Distance: >3 FB Neck ROM: Full    Dental no notable dental hx.    Pulmonary neg pulmonary ROS,    Pulmonary exam normal        Cardiovascular negative cardio ROS Normal cardiovascular exam     Neuro/Psych  Headaches, Anxiety Depression    GI/Hepatic negative GI ROS, Neg liver ROS,   Endo/Other  Morbid obesity (BMI 47)  Renal/GU negative Renal ROS  negative genitourinary   Musculoskeletal negative musculoskeletal ROS (+)   Abdominal   Peds  Hematology negative hematology ROS (+)   Anesthesia Other Findings Day of surgery medications reviewed with patient.  Reproductive/Obstetrics (+) Pregnancy                             Anesthesia Physical Anesthesia Plan  ASA: 3  Anesthesia Plan: Epidural   Post-op Pain Management:    Induction:   PONV Risk Score and Plan: Treatment may vary due to age or medical condition  Airway Management Planned: Natural Airway  Additional Equipment: Fetal Monitoring  Intra-op Plan:   Post-operative Plan:   Informed Consent: I have reviewed the patients History and Physical, chart, labs and discussed the procedure including the risks, benefits and alternatives for the proposed anesthesia with the patient or authorized representative who has indicated his/her understanding and acceptance.       Plan Discussed with:   Anesthesia Plan Comments:         Anesthesia Quick Evaluation

## 2021-09-15 NOTE — Progress Notes (Signed)
  Subjective: In to assess patient. Upon my arrival she is sleeping. She denies feeling contractions. Per nurse pt declined to have pitocin started at 1 am and did not agree until 5 am. + FM no lof   Objective: BP 136/71   Pulse (!) 110   Temp 98.3 F (36.8 C) (Oral)   Resp 14   Ht 5\' 4"  (1.626 m)   Wt 126.2 kg   LMP 12/14/2020   BMI 47.77 kg/m  No intake/output data recorded. No intake/output data recorded.  FHT:  FHR: 140 bpm, variability: moderate,  accelerations:  Present,  decelerations:  Absent UC:   irregular, every 2-7 minutes SVE:   Dilation: 4 Effacement (%): 70, 80 Station: -3 Exam by:: [email or web address], RN  Labs: Lab Results  Component Value Date   WBC 7.0 09/13/2021   HGB 11.2 (L) 09/13/2021   HCT 33.1 (L) 09/13/2021   MCV 84.9 09/13/2021   PLT 244 09/13/2021    Assessment / Plan: Induction of labor due to morbid obesity   Labor: Progressing on Pitocin, will continue to increase then AROM Preeclampsia:   NA Fetal Wellbeing:  Category I overall however episodes of category 2 Pain Control:  Labor support without medications I/D:  n/a Anticipated MOD:  NSVD  09/15/2021, MD 09/15/2021, 6:09 AM

## 2021-09-15 NOTE — Op Note (Signed)
Pre Op Dx:   1. 40 week 2 day EGA pregnancy 2. Induction of labor for maternal obesity, BMI 48 3. Abnormal fetal heart tracing with recurrent prolonged decelerations.    4. Remote from delivery at 6 cm dilation  Post Op Dx:  Same as above  Procedure:  Low Transverse Cesarean Section  Surgeon:  Dr. Steva Ready Assistants:  Rhea Pink, CNM Anesthesia:  Epidural  EBL:  976cc  IVF:  1500cc crystalloid, 250cc Albumin UOP:  50cc clear yellow urine  Drains:  Foley catheter  Specimen removed:  Placenta - sent to pathology Device(s) implanted:  None Case Type:  Clean-contaminated Findings: Normal-appearing uterus, bilateral fallopian tubes, and ovaries. Complications: None Indications:  See previous operative note by Dr. Sallye Ober.  Procedure:  See previous operative note by Dr. Sallye Ober. The placenta was delivered.  The uterus was swept free of clots and debris and closed in a running locked fashion with 0-Vicryl. A second imbricating layer was used to close the uterus using 0-Monocryl. An additional figure-of-eight suture was placed on the right lateral aspect of the hysterotomy for additional hemostasis using 0-Monocryl.  Hemostasis was verified.  The abdomen was irrigated with warmed saline and cleared of clots. Subfascial spaces were inspected and hemostasis assured.  The fascia was closed in a running fashion with 0-PDS.  The subcutaneous tissues were irrigated and hemostasis assured.  The subcutaneous tissues were closed with plain gut suture.  The skin was closed with 4-0 Monocryl. A Prevena dressing applied.  The patient was transferred to PACU.  All needle, sponge, and instrument counts were correct at the end of the case.    Disposition:  PACU  I performed the procedure and the assistant was needed due to the complexity of the anatomy.  Steva Ready, DO

## 2021-09-15 NOTE — Anesthesia Procedure Notes (Signed)
Epidural Patient location during procedure: OB Start time: 09/15/2021 12:01 PM End time: 09/15/2021 12:04 PM  Staffing Anesthesiologist: Kaylyn Layer, MD Performed: anesthesiologist   Preanesthetic Checklist Completed: patient identified, IV checked, risks and benefits discussed, monitors and equipment checked, pre-op evaluation and timeout performed  Epidural Patient position: sitting Prep: DuraPrep and site prepped and draped Patient monitoring: continuous pulse ox, blood pressure and heart rate Approach: midline Location: L3-L4 Injection technique: LOR air  Needle:  Needle type: Tuohy  Needle gauge: 17 G Needle length: 9 cm Needle insertion depth: 6.5 cm Catheter type: closed end flexible Catheter size: 19 Gauge Catheter at skin depth: 12 cm Test dose: negative and Other (1% lidocaine)  Assessment Events: blood not aspirated, injection not painful, no injection resistance, no paresthesia and negative IV test  Additional Notes Patient identified. Risks, benefits, and alternatives discussed with patient including but not limited to bleeding, infection, nerve damage, paralysis, failed block, incomplete pain control, headache, blood pressure changes, nausea, vomiting, reactions to medication, itching, and postpartum back pain. Confirmed with bedside nurse the patient's most recent platelet count. Confirmed with patient that they are not currently taking any anticoagulation, have any bleeding history, or any family history of bleeding disorders. Patient expressed understanding and wished to proceed. All questions were answered. Sterile technique was used throughout the entire procedure. Please see nursing notes for vital signs.   Crisp LOR after one needle redirection. Test dose was given through epidural catheter and negative prior to continuing to dose epidural or start infusion. Warning signs of high block given to the patient including shortness of breath, tingling/numbness in  hands, complete motor block, or any concerning symptoms with instructions to call for help. Patient was given instructions on fall risk and not to get out of bed. All questions and concerns addressed with instructions to call with any issues or inadequate analgesia.  Reason for block:procedure for pain

## 2021-09-15 NOTE — Progress Notes (Signed)
Subjective:    Comfortable with the epidural. Agrees to amniotomy and internal monitoring.  Objective:    VS: BP 102/73   Pulse (!) 117   Temp 98.1 F (36.7 C) (Oral)   Resp (!) 22   Ht 5\' 4"  (1.626 m)   Wt 126.2 kg   LMP 12/14/2020   SpO2 99%   BMI 47.77 kg/m  FHR : baseline 125 / variability moderate / accelerations present / absent decelerations IUPC placed Membranes: AROM, scant, clear Dilation: 6 Effacement (%): 80 Cervical Position: Posterior Station: -3 Presentation: Vertex Exam by:: [email or web address], CNM Pitocin 8 mU/min then stopped  Assessment/Plan:   26 y.o. G1P0 [redacted]w[redacted]d IOL for BMI  Labor:  Entering active phase, amniotomy, followed by prolonged decel, Terbulatine, IUPC, FSE, amnioinfusion 300 cc bolus and 60 cc continuous , Pitocin stopped. Cat II surveillance with deep variables continued despite interventions. Dr. [redacted]w[redacted]d called to bedside. SVE performed, no change in exam,  MD discussed cesarean delivery. See MD note for details.   Sallye Ober DNP, CNM 09/15/2021 5:19 PM

## 2021-09-15 NOTE — Op Note (Addendum)
Patient: Jodi Downs DOB: [birth year] MRN:  [id number]  DATE OF SURGERY: 09/15/2021   PREOP DIAGNOSIS:  1. 40 week 2 day EGA pregnancy. 2. Induction of labor for maternal obesity, BMI 48. 3. Abnormal fetal heart tracing with recurrent prolonged decelerations.    4. Remote from delivery at 6 cm dilation.   POSTOP DIAGNOSIS: Same as above.  PROCEDURE: Emergency Vacuum Assisted Primary low uterine segment transverse cesarean section via Pfannenstiel incision.     SURGEON: Dr.  Hoover Browns  ASSISTANTS: Dr. Alysia Penna, Rhea Pink CNM.   ATTENDING ATTESTATION: I was present and scrubbed and performed the procedure and the assistants were required due to the complexity of anatomy.    ANESTHESIA: Bolused Epidural.    COMPLICATIONS: None  FINDINGS: Viable female infant in cephalic presentation, DOP, weight pending Apgar scores of 9 and 9. Nuchal cord and body cord noted.  Normal uterus and fallopian tubes and ovaries bilaterally.    EBL:  Pending  IV FLUID:   pending   URINE OUTPUT: pending  INDICATIONS:  25 y/o P0 who presented for induction of labor for maternal obesity of 48.  She progressed to 6 cm and AROM was performed.  Abnormal fetal heart tracing was noted subsequently that did not improve despite intrauterine resuscitation.  An emergency cesarean section was then performed as she was remote from delivery. Due to emergent nature of the delivery a verbal consent was obtained from the patient to proceed with the cesarean section after explaining the risks, benefits and alternatives of the procedure including risks of heavy bleeding, infection and damage to organs.     PROCEDURE:  She was taken to the operating room  Betadine was splashed on her abdomen, she was draped and a Foley catheter was placed. She received IV Ancef and IV azithromycin perioperatively. Anesthesia was administered.  A Pfannenstiel incision was made with the scalpel and the incision extended through the layers with  the scalpel.  The fascia was separated from the rectus muscles bilaterally,  inferiorly and then superiorly.  The peritoneal cavity was entered bluntly with the fingers. The bladder and Richardson retractors were placed in.  The uterus was incised with a scalpel and the incision extended bluntly bilaterally with fingers.  Light meconium stained amniotic fluid was noted.  The head could not be delivered easily, uterine incision was extended bluntly and Kiwi Vacuum applied to effect delivery. Delivery effected with one pull of the Kiwi Vacuum to the green zone.  She delivered a viable female infant, apgar scores 9, 9.  Delivery effected within 4 minutes from incision time. The cord was clamped and cut quickly  and baby handed to the pediatric team.   The uterus was not exteriorized.  Uterine edges were grasped with T clamps and ring clamps. At this point Dr. Connye Burkitt took over the rest of the surgery and she will do a separate dictation.    SPECIMEN: Pending  DISPOSITION:Pending  Dr. Hoover Browns.    09/15/21. 6:19 PM.

## 2021-09-16 ENCOUNTER — Encounter (HOSPITAL_COMMUNITY): Payer: Self-pay | Admitting: Obstetrics & Gynecology

## 2021-09-16 DIAGNOSIS — Z98891 History of uterine scar from previous surgery: Secondary | ICD-10-CM

## 2021-09-16 LAB — CBC
HCT: 23.4 % — ABNORMAL LOW (ref 36.0–46.0)
Hemoglobin: 7.9 g/dL — ABNORMAL LOW (ref 12.0–15.0)
MCH: 28.9 pg (ref 26.0–34.0)
MCHC: 33.8 g/dL (ref 30.0–36.0)
MCV: 85.7 fL (ref 80.0–100.0)
Platelets: 192 10*3/uL (ref 150–400)
RBC: 2.73 MIL/uL — ABNORMAL LOW (ref 3.87–5.11)
RDW: 14.6 % (ref 11.5–15.5)
WBC: 23.4 10*3/uL — ABNORMAL HIGH (ref 4.0–10.5)
nRBC: 0 % (ref 0.0–0.2)

## 2021-09-16 MED ORDER — SODIUM CHLORIDE 0.9 % IV SOLN
500.0000 mg | INTRAVENOUS | Status: DC
  Administered 2021-09-16: 500 mg via INTRAVENOUS
  Filled 2021-09-16: qty 25
  Filled 2021-09-16: qty 500
  Filled 2021-09-16: qty 25

## 2021-09-16 MED ORDER — SODIUM CHLORIDE 0.9 % IV SOLN: INTRAVENOUS | Status: DC

## 2021-09-16 MED ORDER — FERROUS SULFATE 325 (65 FE) MG PO TABS
325.0000 mg | ORAL_TABLET | Freq: Two times a day (BID) | ORAL | Status: DC
  Administered 2021-09-16 – 2021-09-17 (×2): 325 mg via ORAL
  Filled 2021-09-16 (×2): qty 1

## 2021-09-16 NOTE — Progress Notes (Signed)
Patient called out because her IV was leaking, on assessment RN noted that IV had infiltrated.  RN stopped iron infusion, removed IV and gave the patient ice for her hand.  RN then called Dr. Richardson Dopp, who stated that if the patient was not feeling weak then she could be switched to oral iron.  RN took a verbal order for oral iron and explained it to the patient, who stated she was not feeling weak and would prefer the oral iron.  Order was put in.   Traylen Eckels, [place]

## 2021-09-16 NOTE — Progress Notes (Signed)
MOB was referred for history of anxiety. * Referral screened out by Clinical Social Worker because none of the following criteria appear to apply: ~ History of anxiety/depression during this pregnancy, or of post-partum depression following prior delivery. ~ Diagnosis of anxiety and/or depression within last 3 years OR * MOB's symptoms currently being treated with medication and/or therapy. Per MOB's OBGYN records, MOB is current with therapist. No mental health concerns noted in [facility] records. "Stable mood" reported during pregnancy.  Please contact the Clinical Social Worker if needs arise, by MOB request, or if MOB scores greater than 9/yes to question 10 on [place] Postpartum Depression Screen.  Signed,  Kea Callan K. Edilberto Roosevelt, MSW, LCSWA, LCASA 09/16/2021 9:26 AM 

## 2021-09-16 NOTE — Progress Notes (Addendum)
Subjective: POD# 1 Information for the patient's newborn:  Jodi Downs, Jodi Downs [[id number]]  female   Baby's Name Jodi Downs Circumcision desires in-patient  Reports feeling "very good" Feeding: breast and formula supplementation Reports tolerating PO and denies N/V, foley removed, ambulating and urinating w/o difficulty  Pain controlled with  PO meds Denies HA/SOB/dizziness  Flatus passing Vaginal bleeding is normal, no clots     Objective:  VS:  Vitals:   09/15/21 2159 09/15/21 2232 09/15/21 2320 09/16/21 0321  BP: 129/64 120/72 127/83 120/81  Pulse: 98 97 99 87  Resp: 20 19 18 18   Temp: 98.2 F (36.8 C) 98.7 F (37.1 C) 99 F (37.2 C) 98.2 F (36.8 C)  TempSrc: Oral Oral Oral Oral  SpO2: 100% 100% 98% 99%  Weight:      Height:        Intake/Output Summary (Last 24 hours) at 09/16/2021 11/16/2021 Last data filed at 09/16/2021 0321 Gross per 24 hour  Intake 1250 ml  Output 2941 ml  Net -1691 ml     Recent Labs    09/13/21 2225  WBC 7.0  HGB 11.2*  HCT 33.1*  PLT 244    Blood type: --/--/O POS (08/30 2225) Rubella: Immune (02/03 0000)    Physical Exam:  General: alert, cooperative, and no distress CV: Regular rate and rhythm Resp: unlabored Abdomen: soft, nontender, normal bowel sounds Incision: provena vacuum seal is clean, dry, and intact Perineum:  Uterine Fundus: firm, below umbilicus, nontender Lochia:  appropriate Ext:  +1 edema, non-pitting, neg for pain, tenderness, and cords   Assessment/Plan: 26 y.o.   POD# 1. G1P1001                  Principal Problem:   Umbilical vein abnormality affecting pregnancy Active Problems:   Morbid obesity (HCC)   Status post primary low transverse cesarean section   Routine post-op PP care          Advance diet as tolerated Advised warm fluids and ambulation to improve GI motility Breastfeeding support Anticipate D/C on post-op day 2 or 3 Declines contraception  26, DNP, CNM 09/16/2021, 6:42 AM

## 2021-09-16 NOTE — Progress Notes (Signed)
In to discuss circumcision of newborn with patient. Per patient her pediatrician  told her there was not much foreskin and circumcision may need to be postponed. I exam the penis and it is very small. Would recommend postponing circumcision until it grows more. Patient is in agreement and will discuss outpatient circumcision with [facility] providers and Pediatrician. She is aware that it may need to be performed by a pediatric urologist.

## 2021-09-16 NOTE — Lactation Note (Signed)
This note was copied from a baby's chart. Lactation Consultation Note  Patient Name: Jodi Downs [id number] Date: 09/16/2021 Reason for consult: Initial assessment;1st time breastfeeding;Term Age:28 hours P1 term female infant. Birth Parent choice is to breastfeed infant and supplement infant with formula. LC did not observe latch, due infant receiving 8 mls of formula prior to [facility] entering the room.  LC gave birth Parent breast shells to wear in bra to help evert nipple shaft out more and hand pump to pre-pump breast prior to latching infant. Birth Parent will continue to work towards latching infant at the breast and will ask RN/LC for further latch assistance if needed. Birth Parent will continue to BF infant according to hunger cues, on demand, 8 to 12+ times within 24 hours, STS. LC gave breastfeeding supplement sheet to [employer]. Birth Parent  made aware of O/P services, breastfeeding support groups, community resources, and our phone # for post-discharge questions.   Maternal Data Has patient been taught Hand Expression?: Yes Does the patient have breastfeeding experience prior to this delivery?: No  Feeding Mother's Current Feeding Choice: Breast Milk and Formula  LATCH Score                    Lactation Tools Discussed/Used Tools: Pump;Flanges;Shells Flange Size: 27 Breast pump type: Manual Pump Education: Setup, frequency, and cleaning;Milk Storage Reason for Pumping: Pre-pump prior to latching infant, Birth Parent has flat inverted nipples. Pumping frequency: Pre-pump breast prior to latching infant.  Interventions Interventions: Breast feeding basics reviewed;Skin to skin;Hand express;Pre-pump if needed;Shells;LC Services brochure;Education  Discharge    Consult Status Consult Status: Follow-up Date: 09/16/21 Follow-up type: In-patient    Danelle Earthly 09/16/2021, 2:05 AM

## 2021-09-17 DIAGNOSIS — D62 Acute posthemorrhagic anemia: Secondary | ICD-10-CM | POA: Diagnosis not present

## 2021-09-17 MED ORDER — FERROUS SULFATE 325 (65 FE) MG PO TABS: 325.0000 mg | ORAL_TABLET | Freq: Two times a day (BID) | ORAL | 3 refills | Status: DC

## 2021-09-17 MED ORDER — IBUPROFEN 600 MG PO TABS: 600.0000 mg | ORAL_TABLET | Freq: Four times a day (QID) | ORAL | 0 refills | Status: DC

## 2021-09-17 MED ORDER — OXYCODONE-ACETAMINOPHEN 5-325 MG PO TABS: 1.0000 | ORAL_TABLET | ORAL | 0 refills | Status: DC | PRN

## 2021-09-17 NOTE — Anesthesia Postprocedure Evaluation (Signed)
Anesthesia Post Note  Patient: Jodi Downs  Procedure(s) Performed: CESAREAN SECTION     Patient location during evaluation: PACU Anesthesia Type: Epidural Level of consciousness: oriented and awake and alert Pain management: pain level controlled Vital Signs Assessment: post-procedure vital signs reviewed and stable Respiratory status: spontaneous breathing, respiratory function stable and patient connected to nasal cannula oxygen Cardiovascular status: blood pressure returned to baseline and stable Postop Assessment: no headache, no backache, no apparent nausea or vomiting and epidural receding Anesthetic complications: no   No notable events documented.                Shelton Silvas

## 2021-09-17 NOTE — Lactation Note (Signed)
This note was copied from a baby's chart. Lactation Consultation Note  Patient Name: Jodi Downs [id number] Date: 09/17/2021 Reason for consult: Follow-up assessment;Primapara;Term;1st time breastfeeding Age:28 hours   P1: Term infant at 40+2 weeks Feeding preference: Breast/formula  "[name]" was swaddled and asleep in birth parent's arms when I arrived.  Birth parent reported that she is wearing her breast shells and has been pre=pumping prior to latching.  She has sensitivity with latching which eases after he begins to suck; no pain noted.  Encouraged to feed 8-12 times/24 hours or sooner if "Catarina Hartshorn" shows cues. Last LATCH score was a 5; voiding/stooling.  He has been consuming large volumes of formula (22 mls- 59 mls).  Discussed appropriate volumes with birth parent; she has the guideline expectations.  Offered to return at the next feeding for latch assistance as desired.  No support person present at this time.     Maternal Data Has patient been taught Hand Expression?: Yes Does the patient have breastfeeding experience prior to this delivery?: No  Feeding Mother's Current Feeding Choice: Breast Milk and Formula  LATCH Score                    Lactation Tools Discussed/Used Tools: Shells;Pump;Flanges Flange Size: 27 Breast pump type: Manual Pump Education: Setup, frequency, and cleaning (No review needed) Reason for Pumping: Nipple eversion  Interventions    Discharge Pump: Manual  Consult Status Consult Status: Follow-up Date: 09/18/21 Follow-up type: In-patient    Sharrod Achille R Laurin Morgenstern 09/17/2021, 9:24 AM

## 2021-09-17 NOTE — Discharge Summary (Addendum)
PCS OB Discharge Summary       Patient Name: Jodi Downs DOB: [birth year] MRN: [id number]  Date of admission: 09/13/2021 Delivering MD: Hoover Browns  Date of delivery: 09/15/2021 Type of delivery: PCS  Newborn Data: Sex: Baby Female Circumcision: out pt r/t small Live born female  Birth Weight: 7 lb 11 oz (3487 g) APGAR: 9, 9  Newborn Delivery   Birth date/time: 09/15/2021 17:36:00 Delivery type: C-Section, Low Transverse C-section categorization: Primary      Feeding: breast and bottle Infant being discharge to home with mother in stable condition.   Admitting diagnosis: Umbilical vein abnormality affecting pregnancy [O35.8XX0] Intrauterine pregnancy: [redacted]w[redacted]d     Secondary diagnosis:  Principal Problem:   Umbilical vein abnormality affecting pregnancy Active Problems:   Morbid obesity (HCC)   Status post primary low transverse cesarean section   Acute blood loss anemia   Fetal bradycardia affecting management of mother, delivered   Normal postpartum course   Postpartum hemorrhage                                Complications: Hemorrhage>1037mL                                                              Intrapartum Procedures: cesarean: low cervical, transverse Postpartum Procedures: none Complications-Operative and Postpartum: none Augmentation: AROM and Cytotec   History of Present Illness: Ms. Jodi Downs is a 26 y.o. female, G1P1001, who presents at [redacted]w[redacted]d weeks gestation. The patient has been followed at  [facility]  Her pregnancy has been complicated by:  Patient Active Problem List   Diagnosis Date Noted   Acute blood loss anemia 09/17/2021   Fetal bradycardia affecting management of mother, delivered 09/17/2021   Normal postpartum course 09/17/2021   Postpartum hemorrhage 09/17/2021   Status post primary low transverse cesarean section 09/16/2021   Umbilical vein abnormality affecting pregnancy 09/13/2021   Vitiligo  03/26/2018   Morbid obesity (HCC) 03/26/2018     Active Ambulatory Problems    Diagnosis Date Noted   Vitiligo 03/26/2018   Morbid obesity (HCC) 03/26/2018   Resolved Ambulatory Problems    Diagnosis Date Noted   No Resolved Ambulatory Problems   Past Medical History:  Diagnosis Date   Anxiety    Depression    Frequent headaches    Migraines    Vitamin D deficiency      Hospital course:  Induction of Labor With Cesarean Section   26 y.o. yo G1P1001 at [redacted]w[redacted]d was admitted to the hospital 09/13/2021 for induction of labor. Patient had a labor course significant for Tritia Endo is a 26 y.o. female presenting for induction of labor for high BMI on 8/30.  G1P0 at 40 weeks with LMP 12/14/20 and EDD 09/13/21 by first trimester U/S.    Prenatal course significant for: Persistent intrahepatic right umbilical vein noted on anatomy and subsequent fetal ultrasounds.  BMI at new OB visit in first trimester was 48. .   The patient went for cesarean section due to Non-Reassuring FHR. Delivery details are as follows: Membrane Rupture Time/Date: 5:12 PM ,09/15/2021   Delivery Method:C-Section, Low Transverse  Details of operation can be found in separate operative Note.  Patient had an uncomplicated postpartum course. She is ambulating, tolerating a regular diet, passing flatus, and urinating well.  Patient is discharged home in stable condition on 09/17/21.      Newborn Data: Birth date:09/15/2021  Birth time:5:36 PM  Gender:Female  Living status:Living  Apgars:9 ,9  Weight:3487 g                               Postpartum Postop Day # 2 :  Hospital Course--Non-Scheduled Cesarean: Patient was admitted on 8/30 for a scheduled IOL for BMI 48 at term then had fetal brady cardia and proceeded with a PCS on 9/1 primary cesarean delivery.   She was taken to the operating room, where Dr. Delora Fuel performed a Primary LTCS under spinal anesthesia, with delivery of a viable baby female, with weight and Apgars  as listed below. Prevena wound placed placed. Infant was in good condition and remained at the patient's bedside, baby female unable to do in pt circ r/t size. Plans for out pt. Total Qbl was 1291 with hgb drop of 11.2-7.9, ordered IV venofer on 9/2, IV infiltrated pt denies new IV placment, ok for PO ferrous, asymptomatic, will go home on PO Iron.  Pt placed on prophylactic lovenox PP for BMI 48. The patient was taken to recovery in good condition.  Patient planned to bottle and breast feed.  On post-op day 1, patient was doing well, tolerating a regular diet.  Throughout her stay, her physical exam was WNL, her incision was CDI, and her vital signs remained stable.  By post-op day 1, she was up ad lib, tolerating a regular diet, with good pain control with po med.  She was deemed to have received the full benefit of her hospital stay, and was discharged home in stable condition.  Contraceptive choice was undecided.    Physical exam  Vitals:   09/16/21 0900 09/16/21 1318 09/17/21 0043 09/17/21 0528  BP:  119/75 120/74 110/87  Pulse:  (!) 105 90 97  Resp: 18 18 18    Temp: 98.5 F (36.9 C) 97.6 F (36.4 C) 97.6 F (36.4 C) 98.2 F (36.8 C)  TempSrc: Oral Oral Oral Oral  SpO2: 100% 100% 100% 100%  Weight:      Height:       General: alert, cooperative, and no distress Lochia: appropriate Uterine Fundus: firm Incision: Healing well with no significant drainage, No significant erythema, Dressing is clean, dry, and intact, honeycomb dressing CDI, Prevena in place.  DVT Evaluation: No evidence of DVT seen on physical exam. Negative Homan's sign. No cords or calf tenderness. No significant calf/ankle edema.  Labs: Lab Results  Component Value Date   WBC 23.4 (H) 09/16/2021   HGB 7.9 (L) 09/16/2021   HCT 23.4 (L) 09/16/2021   MCV 85.7 09/16/2021   PLT 192 09/16/2021      Latest Ref Rng & Units 03/26/2018    3:46 PM  CMP  Glucose 70 - 99 mg/dL 86   BUN 6 - 23 mg/dL 16   Creatinine 0.40  - 1.20 mg/dL 0.76   Sodium 135 - 145 mEq/L 139   Potassium 3.5 - 5.1 mEq/L 3.8   Chloride 96 - 112 mEq/L 106   CO2 19 - 32 mEq/L 27   Calcium 8.4 - 10.5 mg/dL 8.7   Total Protein 6.0 - 8.3 g/dL 7.2   Total Bilirubin 0.2 - 1.2 mg/dL 0.3   Alkaline Phos 39 -  117 U/L 54   AST 0 - 37 U/L 18   ALT 0 - 35 U/L 10     Date of discharge: 09/17/2021 Discharge Diagnoses: Term Pregnancy-delivered Discharge instruction: per After Visit Summary and "Baby and Me Booklet".  After visit meds:   Activity:           unrestricted and pelvic rest Advance as tolerated. Pelvic rest for 6 weeks.  Diet:                routine Medications: PNV, Ibuprofen, Colace, Iron, and Percocet Postpartum contraception: Undecided Condition:  Pt discharge to home with baby in stable condition ABLA: PO Iron Incision: 1 week [facility] removal of prevena wound vac.   Meds: Allergies as of 09/17/2021   No Known Allergies      Medication List     STOP taking these medications    diphenhydrAMINE 25 MG tablet Commonly known as: BENADRYL       TAKE these medications    docusate sodium 100 MG capsule Commonly known as: COLACE Take 100 mg by mouth 2 (two) times daily as needed for mild constipation.   ferrous sulfate 325 (65 FE) MG tablet Take 1 tablet (325 mg total) by mouth 2 (two) times daily with a meal.   ibuprofen 600 MG tablet Commonly known as: ADVIL Take 1 tablet (600 mg total) by mouth every 6 (six) hours.   oxyCODONE-acetaminophen 5-325 MG tablet Commonly known as: PERCOCET/ROXICET Take 1-2 tablets by mouth every 4 (four) hours as needed for moderate pain.   prenatal multivitamin Tabs tablet Take 1 tablet by mouth daily at 12 noon.               Discharge Care Instructions  (From admission, onward)           Start     Ordered   09/17/21 0000  Discharge wound care:       Comments: Take dressing off on day 5-7 postpartum.  Report increased drainage, redness or warmth. Clean with water,  let soap trickle down body. Can leave steri strips on until they fall off or take them off gently at day 10. Keep open to air, clean and dry.   09/17/21 0541            Discharge Follow Up:   Follow-up Information     Hoover Browns, MD. Schedule an appointment as soon as possible for a visit in 1 week(s).   Specialty: Obstetrics and Gynecology Why: 1 week prevena removal and inscision check, 6 week postpartum check. Contact information: [address] [place] [address] [phone]                  [facility] CNM, FNP-C, PMHNP-BC  [address]  [place], [place] [address]  Cell: [phone]  Office Phone: [phone] Fax: [phone] 09/17/2021  6:15 AM

## 2021-09-19 LAB — SURGICAL PATHOLOGY

## 2021-09-27 ENCOUNTER — Telehealth (HOSPITAL_COMMUNITY): Payer: Self-pay | Admitting: *Deleted

## 2021-09-27 NOTE — Telephone Encounter (Signed)
Voicemail box is full.  Duffy Rhody, RN 09-27-2021 at 11:40am

## 2021-10-15 DIAGNOSIS — Z419 Encounter for procedure for purposes other than remedying health state, unspecified: Secondary | ICD-10-CM | POA: Diagnosis not present

## 2021-11-15 DIAGNOSIS — Z419 Encounter for procedure for purposes other than remedying health state, unspecified: Secondary | ICD-10-CM | POA: Diagnosis not present

## 2021-12-15 DIAGNOSIS — Z419 Encounter for procedure for purposes other than remedying health state, unspecified: Secondary | ICD-10-CM | POA: Diagnosis not present

## 2022-01-15 DIAGNOSIS — Z419 Encounter for procedure for purposes other than remedying health state, unspecified: Secondary | ICD-10-CM | POA: Diagnosis not present

## 2022-02-15 DIAGNOSIS — Z419 Encounter for procedure for purposes other than remedying health state, unspecified: Secondary | ICD-10-CM | POA: Diagnosis not present

## 2022-03-16 DIAGNOSIS — Z419 Encounter for procedure for purposes other than remedying health state, unspecified: Secondary | ICD-10-CM | POA: Diagnosis not present

## 2022-04-16 DIAGNOSIS — Z419 Encounter for procedure for purposes other than remedying health state, unspecified: Secondary | ICD-10-CM | POA: Diagnosis not present

## 2022-05-16 DIAGNOSIS — Z419 Encounter for procedure for purposes other than remedying health state, unspecified: Secondary | ICD-10-CM | POA: Diagnosis not present

## 2022-06-16 DIAGNOSIS — Z419 Encounter for procedure for purposes other than remedying health state, unspecified: Secondary | ICD-10-CM | POA: Diagnosis not present

## 2022-07-16 DIAGNOSIS — Z419 Encounter for procedure for purposes other than remedying health state, unspecified: Secondary | ICD-10-CM | POA: Diagnosis not present

## 2022-08-16 DIAGNOSIS — Z419 Encounter for procedure for purposes other than remedying health state, unspecified: Secondary | ICD-10-CM | POA: Diagnosis not present

## 2022-09-16 DIAGNOSIS — Z419 Encounter for procedure for purposes other than remedying health state, unspecified: Secondary | ICD-10-CM | POA: Diagnosis not present

## 2022-10-16 DIAGNOSIS — Z419 Encounter for procedure for purposes other than remedying health state, unspecified: Secondary | ICD-10-CM | POA: Diagnosis not present

## 2022-10-17 DIAGNOSIS — N62 Hypertrophy of breast: Secondary | ICD-10-CM | POA: Diagnosis not present

## 2022-10-17 DIAGNOSIS — E559 Vitamin D deficiency, unspecified: Secondary | ICD-10-CM | POA: Diagnosis not present

## 2022-10-17 DIAGNOSIS — Z113 Encounter for screening for infections with a predominantly sexual mode of transmission: Secondary | ICD-10-CM | POA: Diagnosis not present

## 2022-10-17 DIAGNOSIS — Z3009 Encounter for other general counseling and advice on contraception: Secondary | ICD-10-CM | POA: Diagnosis not present

## 2022-10-17 DIAGNOSIS — Z0001 Encounter for general adult medical examination with abnormal findings: Secondary | ICD-10-CM | POA: Diagnosis not present

## 2022-11-01 ENCOUNTER — Encounter (INDEPENDENT_AMBULATORY_CARE_PROVIDER_SITE_OTHER): Payer: Self-pay | Admitting: Physician Assistant

## 2022-11-01 ENCOUNTER — Ambulatory Visit (INDEPENDENT_AMBULATORY_CARE_PROVIDER_SITE_OTHER): Payer: Medicaid Other | Admitting: Physician Assistant

## 2022-11-01 VITALS — BP 102/72 | HR 87 | Temp 97.8°F | Ht 65.0 in | Wt 254.0 lb

## 2022-11-01 DIAGNOSIS — F32A Depression, unspecified: Secondary | ICD-10-CM

## 2022-11-01 DIAGNOSIS — Z8659 Personal history of other mental and behavioral disorders: Secondary | ICD-10-CM | POA: Diagnosis not present

## 2022-11-01 DIAGNOSIS — Z6841 Body Mass Index (BMI) 40.0 and over, adult: Secondary | ICD-10-CM

## 2022-11-01 DIAGNOSIS — F419 Anxiety disorder, unspecified: Secondary | ICD-10-CM

## 2022-11-01 DIAGNOSIS — Z833 Family history of diabetes mellitus: Secondary | ICD-10-CM | POA: Diagnosis not present

## 2022-11-01 DIAGNOSIS — Z0289 Encounter for other administrative examinations: Secondary | ICD-10-CM

## 2022-11-01 DIAGNOSIS — E66813 Obesity, class 3: Secondary | ICD-10-CM | POA: Diagnosis not present

## 2022-11-01 NOTE — Progress Notes (Signed)
Office: [phone]  /  Fax: [phone]   Initial Visit  Shakeela Rabadan was seen in clinic today to evaluate for obesity. She is interested in losing weight to improve overall health and reduce the risk of weight related complications. She presents today to review program treatment options, initial physical assessment, and evaluation.     She was referred by: PCP  When asked what else they would like to accomplish? She states: Adopt healthier eating patterns, Improve energy levels and physical activity, Improve quality of life, Improve appearance, Improve self-confidence, and Lose a target amount of weight : <200 lbs  Weight history: 10 years ago felt she needed to work on weight loss. Gained more weight during Covid. Very strong family history of Type 2 diabetes in multiple family members.  Has a 35 year old son and wants to get healthier for him as well as herself.  Currently in school .  Went to weight loss clinic locally and treated with phentermine and lost weight.   When asked how has your weight affected you? She states: Has affected self-esteem, Having fatigue, Having poor endurance, Problems with eating patterns, and Has affected mood   Some associated conditions: Vitamin D Deficiency  Contributing factors: Family history, Nutritional, Stress, Reduced physical activity, Eating patterns, and Slow metabolism for age  Weight promoting medications identified: None  Current nutrition plan: None, low carb/low protein  Current level of physical activity: Walking  Current or previous pharmacotherapy: Phentermine thru local weight loss clinic  Response to medication: Lost weight initially but was unable to sustain weight loss   Past medical history includes:   Past Medical History:  Diagnosis Date   Anxiety    Depression    Frequent headaches    Migraines    Vitamin D deficiency    Vitiligo      Objective:   BP 102/72   Pulse 87   Temp 97.8 F (36.6 C)   Ht 5'  5" (1.651 m)   Wt 254 lb (115.2 kg)   SpO2 99%   BMI 42.27 kg/m  She was weighed on the bioimpedance scale: Body mass index is 42.27 kg/m.  Peak Weight:284 lb , Body Fat%:46.2%, Visceral Fat Rating:12, Weight trend over the last 12 months: Decreasing  General:  Alert, oriented and cooperative. Patient is in no acute distress.  Respiratory: Normal respiratory effort, no problems with respiration noted   Gait: able to ambulate independently  Mental Status: Normal mood and affect. Normal behavior. Normal judgment and thought content.   DIAGNOSTIC DATA REVIEWED:  BMET    Component Value Date/Time   NA 139 03/26/2018 1546   K 3.8 03/26/2018 1546   CL 106 03/26/2018 1546   CO2 27 03/26/2018 1546   GLUCOSE 86 03/26/2018 1546   BUN 16 03/26/2018 1546   CREATININE 0.76 03/26/2018 1546   CALCIUM 8.7 03/26/2018 1546   Lab Results  Component Value Date   HGBA1C 5.6 03/26/2018   No results found for: "INSULIN" CBC    Component Value Date/Time   WBC 23.4 (H) 09/16/2021 0552   RBC 2.73 (L) 09/16/2021 0552   HGB 7.9 (L) 09/16/2021 0552   HCT 23.4 (L) 09/16/2021 0552   PLT 192 09/16/2021 0552   MCV 85.7 09/16/2021 0552   MCH 28.9 09/16/2021 0552   MCHC 33.8 09/16/2021 0552   RDW 14.6 09/16/2021 0552   Iron/TIBC/Ferritin/ %Sat No results found for: "IRON", "TIBC", "FERRITIN", "IRONPCTSAT" Lipid Panel     Component Value Date/Time  CHOL 144 03/26/2018 1546   TRIG 59.0 03/26/2018 1546   HDL 50.80 03/26/2018 1546   CHOLHDL 3 03/26/2018 1546   VLDL 11.8 03/26/2018 1546   LDLCALC 82 03/26/2018 1546   Hepatic Function Panel     Component Value Date/Time   PROT 7.2 03/26/2018 1546   ALBUMIN 4.0 03/26/2018 1546   AST 18 03/26/2018 1546   ALT 10 03/26/2018 1546   ALKPHOS 54 03/26/2018 1546   BILITOT 0.3 03/26/2018 1546      Component Value Date/Time   TSH 2.78 03/26/2018 1546     Assessment and Plan:   Family history of type 2 diabetes mellitus  Anxiety and  depression  History of ADHD  Class 3 severe obesity due to excess calories without serious comorbidity with body mass index (BMI) of 40.0 to 44.9 in adult [facility])        Obesity Treatment / Action Plan:  Patient will work on garnering support from family and friends to begin weight loss journey. Will work on eliminating or reducing the presence of highly palatable, calorie dense foods in the home. Will complete provided nutritional and psychosocial assessment questionnaire before the next appointment. Will be scheduled for indirect calorimetry to determine resting energy expenditure in a fasting state.  This will allow [place] to create a reduced calorie, high-protein meal plan to promote loss of fat mass while preserving muscle mass. Will avoid skipping meals which may result in increased hunger signals and overeating at certain times. Counseled on the health benefits of losing 5%-15% of total body weight. Was counseled on nutritional approaches to weight loss and benefits of reducing processed foods and consuming plant-based foods and high quality protein as part of nutritional weight management. Was counseled on pharmacotherapy and role as an adjunct in weight management.   Obesity Education Performed Today:  She was weighed on the bioimpedance scale and results were discussed and documented in the synopsis.  We discussed obesity as a disease and the importance of a more detailed evaluation of all the factors contributing to the disease.  We discussed the importance of long term lifestyle changes which include nutrition, exercise and behavioral modifications as well as the importance of customizing this to her specific health and social needs.  We discussed the benefits of reaching a healthier weight to alleviate the symptoms of existing conditions and reduce the risks of the biomechanical, metabolic and psychological effects of obesity.  Inge Waldroup appears to be in the action stage  of change and states they are ready to start intensive lifestyle modifications and behavioral modifications.  30 minutes was spent today on this visit including the above counseling, pre-visit chart review, and post-visit documentation.  Reviewed by clinician on day of visit: allergies, medications, problem list, medical history, surgical history, family history, social history, and previous encounter notes pertinent to obesity diagnosis.   [employer],PA-C

## 2022-11-13 DIAGNOSIS — N62 Hypertrophy of breast: Secondary | ICD-10-CM | POA: Diagnosis not present

## 2022-11-16 DIAGNOSIS — Z419 Encounter for procedure for purposes other than remedying health state, unspecified: Secondary | ICD-10-CM | POA: Diagnosis not present

## 2022-11-19 DIAGNOSIS — Z30017 Encounter for initial prescription of implantable subdermal contraceptive: Secondary | ICD-10-CM | POA: Diagnosis not present

## 2022-11-22 ENCOUNTER — Encounter (INDEPENDENT_AMBULATORY_CARE_PROVIDER_SITE_OTHER): Payer: Self-pay | Admitting: *Deleted

## 2022-11-28 ENCOUNTER — Encounter (INDEPENDENT_AMBULATORY_CARE_PROVIDER_SITE_OTHER): Payer: Self-pay | Admitting: Internal Medicine

## 2022-11-28 ENCOUNTER — Ambulatory Visit (INDEPENDENT_AMBULATORY_CARE_PROVIDER_SITE_OTHER): Payer: Medicaid Other | Admitting: Internal Medicine

## 2022-11-28 VITALS — BP 120/82 | HR 88 | Temp 98.1°F | Ht 65.0 in | Wt 255.0 lb

## 2022-11-28 DIAGNOSIS — Z1331 Encounter for screening for depression: Secondary | ICD-10-CM

## 2022-11-28 DIAGNOSIS — Z6841 Body Mass Index (BMI) 40.0 and over, adult: Secondary | ICD-10-CM | POA: Diagnosis not present

## 2022-11-28 DIAGNOSIS — D649 Anemia, unspecified: Secondary | ICD-10-CM

## 2022-11-28 DIAGNOSIS — R5383 Other fatigue: Secondary | ICD-10-CM

## 2022-11-28 DIAGNOSIS — E66813 Obesity, class 3: Secondary | ICD-10-CM | POA: Diagnosis not present

## 2022-11-28 DIAGNOSIS — R0602 Shortness of breath: Secondary | ICD-10-CM | POA: Diagnosis not present

## 2022-11-28 DIAGNOSIS — Z7689 Persons encountering health services in other specified circumstances: Secondary | ICD-10-CM | POA: Diagnosis not present

## 2022-11-28 DIAGNOSIS — R7303 Prediabetes: Secondary | ICD-10-CM | POA: Diagnosis not present

## 2022-11-28 NOTE — Assessment & Plan Note (Signed)
Upon review of her labs she had abnormal hematological parameters few years ago.  She is also at risk for iron deficiency.  We will check a CBC and iron studies.

## 2022-11-28 NOTE — Assessment & Plan Note (Signed)
 See obesity treatment plan

## 2022-11-28 NOTE — Progress Notes (Deleted)
.  emne

## 2022-11-28 NOTE — Progress Notes (Signed)
Office: [phone]  /  Fax: [phone]   Subjective   Initial Visit  Jodi Downs (MR# [id number]) is an 27 y.o. female who presents for evaluation and treatment of obesity and related comorbidities. Current BMI is Body mass index is 42.43 kg/m. Jodi Downs has been struggling with her weight for many years and has been unsuccessful in either losing weight, maintaining weight loss, or reaching her healthy weight goal.  Jodi Downs is currently in the action stage of change and ready to dedicate time achieving and maintaining a healthier weight. Jodi Downs is interested in becoming our patient and working on intensive lifestyle modifications including (but not limited to) diet and exercise for weight loss.  When asked how their weight has affected their life and health, she states:  Has affected self-esteem, Having fatigue, Having poor endurance, Problems with eating patterns, and Has affected mood   When asked what else they would like to accomplish? She states: Adopt healthier eating patterns, Improve energy levels and physical activity, Improve quality of life, Improve appearance, Improve self-confidence, and Lose a target amount of weight : <200 lbs   Weight history:  She starting to note weight gain during : teens.  Life events associated with weight gain include :  started depo .   Other contributing factors: Consumption of highly palatable foods, Moderate to high levels of stress, Reduced physical activity, and Eating patterns.  Their highest weight has been:  280 lbs.  Previous weight-loss programs : Low Carb and Tracking and Journaling.  Their maximum weight loss was:  40-50 lbs.  Their greatest challenge with dieting:  lack of support from partner .  Weight promoting medications identified: None  Current or previous pharmacotherapy: Phentermine.  Response to medication: Lost weight and was able to maintain weight loss  Nutritional History:  Current nutrition plan:  Portion control / smart choices.  How often do they eat breakfast : 3-5 days a week.  Number of times they eat per day: 2  What beverages do they drink: caffeinated beverages and other lipton diet sweet tea, protein shakes  Use of artificial sweetners : No  Food intolerances or restrictions: dairy.  Food triggers: Stress and Sad .  Food cravings:  pastas  Current level of physical activity: Walking  Past medical history includes:   Past Medical History:  Diagnosis Date   ADD (attention deficit disorder)    Anxiety    Back pain    Depression    Frequent headaches    Joint pain    Migraines    Pre-diabetes    Vitamin D deficiency    Vitiligo      Objective   BP 120/82   Pulse 88   Temp 98.1 F (36.7 C)   Ht 5\' 5"  (1.651 m)   Wt 255 lb (115.7 kg)   SpO2 98%   BMI 42.43 kg/m  She was weighed on the bioimpedance scale: Body mass index is 42.43 kg/m.    Anthropometrics:  Vitals Temp: 98.1 F (36.7 C) BP: 120/82 Pulse Rate: 88 SpO2: 98 %   Anthropometric Measurements Height: 5\' 5"  (1.651 m) Weight: 255 lb (115.7 kg) BMI (Calculated): 42.43 Starting Weight: 255 lb Peak Weight: 284 lb Waist Measurement : 48 inches   Body Composition  Body Fat %: 46 % Fat Mass (lbs): 117.4 lbs Muscle Mass (lbs): 131 lbs Total Body Water (lbs): 93 lbs Visceral Fat Rating : 12   Other Clinical Data RMR: 1901 Fasting: Yes Labs: Yes Today's Visit #: 1 Starting  Date: 11/28/22    Physical Exam:  General: She is overweight, cooperative, alert, well developed, and in no acute distress. PSYCH: Has normal mood, affect and thought process.   HEENT: EOMI, sclerae are anicteric. Lungs: Normal breathing effort, no conversational dyspnea. Extremities: No edema.  Neurologic: No gross sensory or motor deficits. No tremors or fasciculations noted.    Diagnostic Data Reviewed  EKG: Normal sinus rhythm, rate 82 bpm.  There are no conduction abnormalities or signs of  chamber enlargement.  Indirect Calorimeter completed today shows a VO2 of 276 and a REE of 1901.  Her calculated basal metabolic rate is 1324 thus her resting energy expenditure same as calculated.  Depression Screen  Liliyana's PHQ-9 score was: 12.     03/26/2018    2:51 PM  Depression screen PHQ 2/9  Decreased Interest 0  Down, Depressed, Hopeless 0  PHQ - 2 Score 0    Screening for Sleep Related Breathing Disorders  Jodi Downs denies daytime somnolence and denies waking up still tired. Patient denies a history of symptoms of OSA.Jodi Downs generally gets 8 hours of sleep per night, and states that she has generally restful sleep. Snoring is present. Apneic episodes are not present. Epworth Sleepiness Score is 7.   BMET    Component Value Date/Time   NA 139 03/26/2018 1546   K 3.8 03/26/2018 1546   CL 106 03/26/2018 1546   CO2 27 03/26/2018 1546   GLUCOSE 86 03/26/2018 1546   BUN 16 03/26/2018 1546   CREATININE 0.76 03/26/2018 1546   CALCIUM 8.7 03/26/2018 1546   Lab Results  Component Value Date   HGBA1C 5.6 03/26/2018   No results found for: "INSULIN" CBC    Component Value Date/Time   WBC 23.4 (H) 09/16/2021 0552   RBC 2.73 (L) 09/16/2021 0552   HGB 7.9 (L) 09/16/2021 0552   HCT 23.4 (L) 09/16/2021 0552   PLT 192 09/16/2021 0552   MCV 85.7 09/16/2021 0552   MCH 28.9 09/16/2021 0552   MCHC 33.8 09/16/2021 0552   RDW 14.6 09/16/2021 0552   Iron/TIBC/Ferritin/ %Sat No results found for: "IRON", "TIBC", "FERRITIN", "IRONPCTSAT" Lipid Panel     Component Value Date/Time   CHOL 144 03/26/2018 1546   TRIG 59.0 03/26/2018 1546   HDL 50.80 03/26/2018 1546   CHOLHDL 3 03/26/2018 1546   VLDL 11.8 03/26/2018 1546   LDLCALC 82 03/26/2018 1546   Hepatic Function Panel     Component Value Date/Time   PROT 7.2 03/26/2018 1546   ALBUMIN 4.0 03/26/2018 1546   AST 18 03/26/2018 1546   ALT 10 03/26/2018 1546   ALKPHOS 54 03/26/2018 1546   BILITOT 0.3 03/26/2018 1546       Component Value Date/Time   TSH 2.78 03/26/2018 1546     Assessment and Plan   TREATMENT PLAN FOR OBESITY:  Recommended Dietary Goals  Rodneisha is currently in the action stage of change. As such, her goal is to implement medically supervised weight loss plan.  She has agreed to implement: the Category 3 plan - 1500 kcal per day  Behavioral Intervention  We discussed the following Behavioral Modification Strategies today: increasing lean protein intake to established goals, decreasing simple carbohydrates , increasing vegetables, increasing lower glycemic fruits, increasing fiber rich foods, avoiding skipping meals, increasing water intake, work on meal planning and preparation, reading food labels , keeping healthy foods at home, identifying sources and decreasing liquid calories, decreasing eating out or consumption of processed foods, and making healthy  choices when eating convenient foods, planning for success, and better snacking choices  Additional resources provided today:  Category 3 packet with overview and instructions  Recommended Physical Activity Goals  Kelaiah has been advised to work up to 150 minutes of moderate intensity aerobic activity a week and strengthening exercises 2-3 times per week for cardiovascular health, weight loss maintenance and preservation of muscle mass.   She has agreed to :  Think about enjoyable ways to increase daily physical activity and overcoming barriers to exercise and Increase physical activity in their day and reduce sedentary time (increase NEAT).  Pharmacotherapy We will work on building a Therapist, art and behavioral strategies. We will discuss the role of pharmacotherapy as an adjunct at subsequent visits.  Patient is interested in GLP-1 therapy.  Had been on phentermine in the past.  ASSOCIATED CONDITIONS ADDRESSED TODAY  Other Fatigue Kayse denies daytime somnolence and denies waking up still tired.  Patient denies a history of symptoms of OSA.Jodi Downs generally gets 8 hours of sleep per night, and states that she has generally restful sleep. Snoring is present. Apneic episodes are not present. Epworth Sleepiness Score is 7.  Mackay does feel that her weight is causing her energy to be lower than it should be. Fatigue may be related to obesity, depression or many other causes. Labs will be ordered, and in the meanwhile, Talyia will focus on self care including making healthy food choices, increasing physical activity and focusing on stress reduction.  Shortness of Breath Ajee notes increasing shortness of breath with exercising and seems to be worsening over time with weight gain. She notes getting out of breath sooner with activity than she used to. This has not gotten worse recently. Hinsley denies shortness of breath at rest or orthopnea.Tona notes increasing shortness of breath with exercising and seems to be worsening over time with weight gain. She notes getting out of breath sooner with activity than she used to. This has not gotten worse recently. Areeba denies shortness of breath at rest or orthopnea.  Other fatigue -     EKG 12-Lead -     Vitamin B12 -     CBC with Differential/Platelet  SOB (shortness of breath) on exertion  Depression screen  Anemia, unspecified type Assessment & Plan: Upon review of her labs she had abnormal hematological parameters few years ago.  She is also at risk for iron deficiency.  We will check a CBC and iron studies.  Orders: -     CBC with Differential/Platelet -     Ferritin -     Iron and TIBC  Prediabetes Assessment & Plan: Per history.  She also has a strong family history of diabetes.  We will check hemoglobin A1c, fasting blood glucose and insulin levels for screening.  We discussed low-carb high-protein meal plan today.  She will work on reducing simple and added sugars in her diet  Orders: -     Hemoglobin A1c -      Insulin, random -     Lipid Panel With LDL/HDL Ratio  Class 3 severe obesity with serious comorbidity and body mass index (BMI) of 40.0 to 44.9 in adult, unspecified obesity type (HCC) Assessment & Plan: See obesity treatment plan  Orders: -     Comprehensive metabolic panel -     Hemoglobin A1c -     Insulin, random -     TSH -     VITAMIN D 25 Hydroxy (Vit-D Deficiency, Fractures) -  Lipid Panel With LDL/HDL Ratio -     Ferritin -     Iron and TIBC    Follow-up  She was informed of the importance of frequent follow-up visits to maximize her success with intensive lifestyle modifications for her multiple health conditions. She was informed we would discuss her lab results at her next visit unless there is a critical issue that needs to be addressed sooner. Kalyssa agreed to keep her next visit at the agreed upon time to discuss these results.  Attestation Statement  Reviewed by clinician on day of visit: allergies, medications, problem list, medical history, surgical history, family history, social history, and previous encounter notes.  I have spent 60 minutes in the care of the patient today including: preparing to see patient (e.g. review and interpretation of tests, old notes ), obtaining and/or reviewing separately obtained history, performing a medically appropriate examination or evaluation, counseling and educating the patient, ordering medications, test or procedures, documenting clinical information in the electronic or other health care record, and independently interpreting results and communicating results to the patient, family, or caregiver      Worthy Rancher, MD

## 2022-11-28 NOTE — Assessment & Plan Note (Addendum)
Per history.  She also has a strong family history of diabetes.  We will check hemoglobin A1c, fasting blood glucose and insulin levels for screening.  We discussed low-carb high-protein meal plan today.  She will work on reducing simple and added sugars in her diet

## 2022-11-29 LAB — CBC WITH DIFFERENTIAL/PLATELET
Basophils Absolute: 0 10*3/uL (ref 0.0–0.2)
Basos: 1 %
EOS (ABSOLUTE): 0.2 10*3/uL (ref 0.0–0.4)
Eos: 3 %
Hematocrit: 41.4 % (ref 34.0–46.6)
Hemoglobin: 13.1 g/dL (ref 11.1–15.9)
Immature Grans (Abs): 0 10*3/uL (ref 0.0–0.1)
Immature Granulocytes: 0 %
Lymphocytes Absolute: 2.2 10*3/uL (ref 0.7–3.1)
Lymphs: 40 %
MCH: 28.6 pg (ref 26.6–33.0)
MCHC: 31.6 g/dL (ref 31.5–35.7)
MCV: 90 fL (ref 79–97)
Monocytes Absolute: 0.3 10*3/uL (ref 0.1–0.9)
Monocytes: 5 %
Neutrophils Absolute: 2.9 10*3/uL (ref 1.4–7.0)
Neutrophils: 51 %
Platelets: 351 10*3/uL (ref 150–450)
RBC: 4.58 x10E6/uL (ref 3.77–5.28)
RDW: 14.2 % (ref 11.7–15.4)
WBC: 5.6 10*3/uL (ref 3.4–10.8)

## 2022-11-29 LAB — TSH: TSH: 1.99 u[IU]/mL (ref 0.450–4.500)

## 2022-11-29 LAB — VITAMIN D 25 HYDROXY (VIT D DEFICIENCY, FRACTURES): Vit D, 25-Hydroxy: 13.5 ng/mL — ABNORMAL LOW (ref 30.0–100.0)

## 2022-11-29 LAB — LIPID PANEL WITH LDL/HDL RATIO
Cholesterol, Total: 181 mg/dL (ref 100–199)
HDL: 42 mg/dL (ref >39)
LDL Chol Calc (NIH): 127 mg/dL — ABNORMAL HIGH (ref 0–99)
LDL/HDL Ratio: 3 ratio (ref 0.0–3.2)
Triglycerides: 64 mg/dL (ref 0–149)
VLDL Cholesterol Cal: 12 mg/dL (ref 5–40)

## 2022-11-29 LAB — HEMOGLOBIN A1C
Est. average glucose Bld gHb Est-mCnc: 111 mg/dL
Hgb A1c MFr Bld: 5.5 % (ref 4.8–5.6)

## 2022-11-29 LAB — COMPREHENSIVE METABOLIC PANEL
ALT: 13 [IU]/L (ref 0–32)
AST: 18 [IU]/L (ref 0–40)
Albumin: 4 g/dL (ref 4.0–5.0)
Alkaline Phosphatase: 50 [IU]/L (ref 44–121)
BUN/Creatinine Ratio: 14 (ref 9–23)
BUN: 12 mg/dL (ref 6–20)
Bilirubin Total: 0.3 mg/dL (ref 0.0–1.2)
CO2: 19 mmol/L — ABNORMAL LOW (ref 20–29)
Calcium: 8.8 mg/dL (ref 8.7–10.2)
Chloride: 104 mmol/L (ref 96–106)
Creatinine, Ser: 0.84 mg/dL (ref 0.57–1.00)
Globulin, Total: 2.4 g/dL (ref 1.5–4.5)
Glucose: 83 mg/dL (ref 70–99)
Potassium: 4.5 mmol/L (ref 3.5–5.2)
Sodium: 139 mmol/L (ref 134–144)
Total Protein: 6.4 g/dL (ref 6.0–8.5)
eGFR: 96 mL/min/{1.73_m2} (ref >59)

## 2022-11-29 LAB — FERRITIN: Ferritin: 26 ng/mL (ref 15–150)

## 2022-11-29 LAB — IRON AND TIBC
Iron Saturation: 18 % (ref 15–55)
Iron: 70 ug/dL (ref 27–159)
Total Iron Binding Capacity: 386 ug/dL (ref 250–450)
UIBC: 316 ug/dL (ref 131–425)

## 2022-11-29 LAB — INSULIN, RANDOM: INSULIN: 17.7 u[IU]/mL (ref 2.6–24.9)

## 2022-11-29 LAB — VITAMIN B12: Vitamin B-12: 383 pg/mL (ref 232–1245)

## 2022-12-16 DIAGNOSIS — Z419 Encounter for procedure for purposes other than remedying health state, unspecified: Secondary | ICD-10-CM | POA: Diagnosis not present

## 2022-12-17 ENCOUNTER — Ambulatory Visit (INDEPENDENT_AMBULATORY_CARE_PROVIDER_SITE_OTHER): Payer: Medicaid Other | Admitting: Family Medicine

## 2022-12-17 ENCOUNTER — Encounter (INDEPENDENT_AMBULATORY_CARE_PROVIDER_SITE_OTHER): Payer: Self-pay | Admitting: Family Medicine

## 2022-12-17 DIAGNOSIS — E559 Vitamin D deficiency, unspecified: Secondary | ICD-10-CM | POA: Insufficient documentation

## 2022-12-17 DIAGNOSIS — E88819 Insulin resistance, unspecified: Secondary | ICD-10-CM | POA: Insufficient documentation

## 2022-12-17 DIAGNOSIS — D62 Acute posthemorrhagic anemia: Secondary | ICD-10-CM | POA: Diagnosis not present

## 2022-12-17 DIAGNOSIS — E785 Hyperlipidemia, unspecified: Secondary | ICD-10-CM | POA: Diagnosis not present

## 2022-12-17 DIAGNOSIS — Z6841 Body Mass Index (BMI) 40.0 and over, adult: Secondary | ICD-10-CM

## 2022-12-17 DIAGNOSIS — E7800 Pure hypercholesterolemia, unspecified: Secondary | ICD-10-CM

## 2022-12-17 NOTE — Assessment & Plan Note (Signed)

## 2022-12-17 NOTE — Assessment & Plan Note (Addendum)
Discussed importance of vitamin d supplementation.  Vitamin d supplementation has been shown to decrease fatigue, decrease risk of progression to insulin resistance and then prediabetes, decreases risk of falling in older age and can even assist in decreasing depressive symptoms in PTSD.   Patient has a 2 month prescription from PCP that she has taken 3 weeks worth of.  Will refill in 4 weeks as it is unlikely for her to achieve goal of 60-80 with 2 month supply.

## 2022-12-17 NOTE — Assessment & Plan Note (Signed)
Patient mentions she had a hemorrhage at time of delivery accounting for her anemia.  Repeat CBC and Anemia panel done at first appointment by Dr. Rikki Spearing all within normal limits.  No need to repeat anemia panel at this time.

## 2022-12-17 NOTE — Progress Notes (Signed)
SUBJECTIVE:  Chief Complaint: Obesity  Interim History: New patient first follow up.  Patient had a baby in September of 2023 [place]).  She got nexplanon a month ago.  Since initial appointment patient celebrated Thanksgiving at home. She started on Category 3 plan and felt the quantity of food to be sufficient.  She did not weigh out the protein as she didn't have a food scale. Patient relatively enjoyed the plan and voices that she doesn't need any changes to be made.  She has been doing some meal prep and food prep. For the most part found total protein intake for the day sometimes a bit difficult.  In next few weeks patient is planning on moving.  She is not anticipating any obstacles to following the plan over the next few weeks.   Zyaria is here to discuss her progress with her obesity treatment plan. She is on the Category 3 Plan and keeping a food journal and adhering to recommended goals of 1500 calories and states she is following her eating plan approximately 87 % of the time. She states she is walking 1- 3 miles 2-3 times per week.   OBJECTIVE: Visit Diagnoses: Problem List Items Addressed This Visit       Endocrine   Insulin resistance    Pathophysiology of progression through insulin resistance to prediabetes and diabetes was discussed at length today.  Patient to continue to monitor and be in control of total intake of snack calories which may be simple carbohydrates but should be consumed only after the patient has taken in all the nutrition for the day.  Macronutrient identification, classification and daily intake ratios were discussed.  Plan to repeat labs in 3 months to monitor both hemoglobin A1c and insulin levels.  No medications at this time as patient is not having significant hunger or cravings that would make following meal plan more difficult.           Other   Morbid obesity (HCC) - Primary   Acute blood loss anemia    Patient mentions she had a hemorrhage at  time of delivery accounting for her anemia.  Repeat CBC and Anemia panel done at first appointment by Dr. Rikki Spearing all within normal limits.  No need to repeat anemia panel at this time.      Vitamin D deficiency    Discussed importance of vitamin d supplementation.  Vitamin d supplementation has been shown to decrease fatigue, decrease risk of progression to insulin resistance and then prediabetes, decreases risk of falling in older age and can even assist in decreasing depressive symptoms in PTSD.   Patient has a 2 month prescription from PCP that she has taken 3 weeks worth of.  Will refill in 4 weeks as it is unlikely for her to achieve goal of 60-80 with 2 month supply.       HLD (hyperlipidemia)    LDL slightly elevated today at 127.  HDL below goal of 50 (at 42 currently).  Triglycerides well controlled.  Discussed staying within snack calories to ensure control of trans and saturated fat.  Will repeat FLP in 3 months to ensure improvement in cholesterols.      Other Visit Diagnoses     BMI 40.0-44.9, adult (HCC)           Vitals Temp: 98.2 F (36.8 C) BP: 117/73 Pulse Rate: 87 SpO2: 99 %   Anthropometric Measurements Height: 5\' 5"  (1.651 m) Weight: 249 lb (112.9 kg) BMI (Calculated): 41.44  Weight at Last Visit: 255 lb Weight Lost Since Last Visit: 6 Weight Gained Since Last Visit: 0 Starting Weight: 255 lb Total Weight Loss (lbs): 6 lb (2.722 kg)   Body Composition  Body Fat %: 45 % Fat Mass (lbs): 112.4 lbs Muscle Mass (lbs): 130.2 lbs Total Body Water (lbs): 90.8 lbs Visceral Fat Rating : 11   Other Clinical Data Fasting: yes Labs: no Today's Visit #: 2 Starting Date: 11/28/22     ASSESSMENT AND PLAN:  Diet: Bryanah is currently in the action stage of change. As such, her goal is to continue with weight loss efforts. She has agreed to Category 3 Plan.  Exercise: Rakayla has been instructed that some exercise is better than none for weight loss  and overall health benefits.   Behavior Modification:  We discussed the following Behavioral Modification Strategies today: increasing lean protein intake, increasing vegetables, meal planning and cooking strategies, and planning for success.  No follow-ups on file.Marland Kitchen She was informed of the importance of frequent follow up visits to maximize her success with intensive lifestyle modifications for her multiple health conditions.  Attestation Statements:   Reviewed by clinician on day of visit: allergies, medications, problem list, medical history, surgical history, family history, social history, and previous encounter notes.   Time spent on visit including pre-visit chart review and post-visit care and charting was 42 minutes.   Reuben Likes, MD

## 2022-12-17 NOTE — Assessment & Plan Note (Signed)
LDL slightly elevated today at 127.  HDL below goal of 50 (at 42 currently).  Triglycerides well controlled.  Discussed staying within snack calories to ensure control of trans and saturated fat.  Will repeat FLP in 3 months to ensure improvement in cholesterols.

## 2023-01-01 ENCOUNTER — Ambulatory Visit (INDEPENDENT_AMBULATORY_CARE_PROVIDER_SITE_OTHER): Payer: Medicaid Other | Admitting: Family Medicine

## 2023-01-01 ENCOUNTER — Encounter (INDEPENDENT_AMBULATORY_CARE_PROVIDER_SITE_OTHER): Payer: Self-pay | Admitting: Family Medicine

## 2023-01-01 VITALS — BP 84/74 | HR 122 | Temp 99.0°F | Ht 65.0 in | Wt 248.0 lb

## 2023-01-01 DIAGNOSIS — E669 Obesity, unspecified: Secondary | ICD-10-CM

## 2023-01-01 DIAGNOSIS — E559 Vitamin D deficiency, unspecified: Secondary | ICD-10-CM | POA: Diagnosis not present

## 2023-01-01 DIAGNOSIS — R7303 Prediabetes: Secondary | ICD-10-CM | POA: Diagnosis not present

## 2023-01-01 DIAGNOSIS — Z6841 Body Mass Index (BMI) 40.0 and over, adult: Secondary | ICD-10-CM

## 2023-01-01 MED ORDER — VITAMIN D (ERGOCALCIFEROL) 1.25 MG (50000 UNIT) PO CAPS: 50000.0000 [IU] | ORAL_CAPSULE | ORAL | 0 refills | Status: DC

## 2023-01-01 NOTE — Assessment & Plan Note (Signed)
Patient on RX vitamin d given her low level of Vitamin D.  She needs a refill today.  No nausea, vomiting or muscle weakness. Continue RX supplementation and repeat labs in 2 months.

## 2023-01-01 NOTE — Progress Notes (Signed)
   SUBJECTIVE:  Chief Complaint: Obesity  Interim History: Patient has been moving since she was last seen.  This has been a process. Hoping to be done in the next few days. The last week has been somewhat more difficult as her groceries are up in [employer].  She had [employer] dinners recently but previously was cooking more. She is anticipating life to be slightly easier when the move is complete. For the next few weeks she wants to get back to the meal plan she was on previously.  No plans coming up.   Jodi Downs is here to discuss her progress with her obesity treatment plan. She is on the Category 3 Plan and states she is following her eating plan approximately 50 % of the time. She states she is walking moving households.   OBJECTIVE: Visit Diagnoses: Problem List Items Addressed This Visit       Other   Prediabetes   Last A1c controlled at 5.5 and Insulin still slightly elevated.  She is following meal pan and being mindful of carb intake and focusing on increasing protein intake.  She will need repeat labs in March of 2025      Vitamin D deficiency - Primary   Patient on RX vitamin d given her low level of Vitamin D.  She needs a refill today.  No nausea, vomiting or muscle weakness. Continue RX supplementation and repeat labs in 2 months.       Relevant Medications   Vitamin D, Ergocalciferol, (DRISDOL) 1.25 MG (50000 UNIT) CAPS capsule    No data recorded  No data recorded  No data recorded  No data recorded    ASSESSMENT AND PLAN:  Diet: Jodi Downs is currently in the action stage of change. As such, her goal is to continue with weight loss efforts. She has agreed to Category 3 Plan.  Exercise: Jodi Downs has been instructed that some exercise is better than none for weight loss and overall health benefits.   Behavior Modification:  We discussed the following Behavioral Modification Strategies today: increasing lean protein intake, increasing vegetables, meal  planning and cooking strategies, emotional eating strategies , holiday eating strategies, avoiding temptations, and planning for success.   No follow-ups on file.Marland Kitchen She was informed of the importance of frequent follow up visits to maximize her success with intensive lifestyle modifications for her multiple health conditions.  Attestation Statements:   Reviewed by clinician on day of visit: allergies, medications, problem list, medical history, surgical history, family history, social history, and previous encounter notes.    Reuben Likes, MD

## 2023-01-06 NOTE — Assessment & Plan Note (Signed)
Last A1c controlled at 5.5 and Insulin still slightly elevated.  She is following meal pan and being mindful of carb intake and focusing on increasing protein intake.  She will need repeat labs in March of 2025

## 2023-01-16 DIAGNOSIS — Z419 Encounter for procedure for purposes other than remedying health state, unspecified: Secondary | ICD-10-CM | POA: Diagnosis not present

## 2023-02-05 ENCOUNTER — Ambulatory Visit (INDEPENDENT_AMBULATORY_CARE_PROVIDER_SITE_OTHER): Payer: Medicaid Other | Admitting: Plastic Surgery

## 2023-02-05 VITALS — BP 130/68 | HR 93 | Ht 65.0 in | Wt 257.4 lb

## 2023-02-05 DIAGNOSIS — G8929 Other chronic pain: Secondary | ICD-10-CM

## 2023-02-05 DIAGNOSIS — N62 Hypertrophy of breast: Secondary | ICD-10-CM | POA: Insufficient documentation

## 2023-02-05 DIAGNOSIS — R7303 Prediabetes: Secondary | ICD-10-CM

## 2023-02-05 DIAGNOSIS — Z6841 Body Mass Index (BMI) 40.0 and over, adult: Secondary | ICD-10-CM | POA: Diagnosis not present

## 2023-02-05 DIAGNOSIS — M546 Pain in thoracic spine: Secondary | ICD-10-CM | POA: Diagnosis not present

## 2023-02-05 DIAGNOSIS — M549 Dorsalgia, unspecified: Secondary | ICD-10-CM | POA: Insufficient documentation

## 2023-02-05 NOTE — Progress Notes (Signed)
Patient ID: Jodi Downs, female    DOB: Sep 24, 1995, 28 y.o.   MRN: [id number]   Chief Complaint  Patient presents with   Advice Only   Skin Problem    HPI Mammary Hyperplasia: The patient is a 28 y.o. female with a history of mammary hyperplasia for several years.  She has extremely large breasts causing symptoms that include the following: Back pain in the upper and lower back, including neck pain. She pulls or pins her bra straps to provide better lift and relief of the pressure and pain. She notices relief by holding her breast up manually.  Her shoulder straps cause grooves and pain and pressure that requires padding for relief. Pain medication is sometimes required with motrin and tylenol.  Activities that are hindered by enlarged breasts include: exercise and running.  She has tried supportive clothing as well as fitted bras without improvement.  Her breasts are extremely large and fairly symmetric.  She has hyperpigmentation of the inframammary area on both sides.  The sternal to nipple distance on the right is 41 cm and the left is 41 cm.  The IMF distance is 18 cm.  She is 5 feet 5 inches tall and weighs 257 pounds.  The BMI = 42.8 kg/m.  Preoperative bra size = K cup.  The estimated excess breast tissue to be removed at the time of surgery = 895 grams on the left and 895 grams on the right.  Mammogram history: none  Family history of breast cancer:  sister, genetic negative.  Tobacco use:  none.   The patient expresses the desire to pursue surgical intervention.  Review of Systems  Constitutional:  Positive for activity change. Negative for appetite change.  HENT: Negative.    Eyes: Negative.   Respiratory: Negative.    Cardiovascular: Negative.  Negative for leg swelling.  Gastrointestinal: Negative.   Endocrine: Negative.   Genitourinary: Negative.   Musculoskeletal:  Positive for back pain and neck pain.  Skin:  Positive for rash.    Past Medical History:   Diagnosis Date   ADD (attention deficit disorder)    Anxiety    Back pain    Depression    Frequent headaches    Joint pain    Migraines    Pre-diabetes    Vitamin D deficiency    Vitiligo     Past Surgical History:  Procedure Laterality Date   CESAREAN SECTION N/A 09/15/2021   Procedure: CESAREAN SECTION;  Surgeon: Hoover Browns, MD;  Location: [facility];  Service: Obstetrics;  Laterality: N/A;   MOUTH SURGERY Bilateral 2019   wisdom teeth removal , tooth extraction w/ bone graph 2019   TONSILLECTOMY AND ADENOIDECTOMY  2000   WISDOM TOOTH EXTRACTION        Current Outpatient Medications:    Cholecalciferol (VITAMIN D3) 50 MCG (2000 UT) CAPS, Take by mouth once a week. daily, Disp: , Rfl:    etonogestrel (NEXPLANON) 68 MG IMPL implant, 1 each by Subdermal route once., Disp: , Rfl:    Vitamin D, Ergocalciferol, (DRISDOL) 1.25 MG (50000 UNIT) CAPS capsule, Take 1 capsule (50,000 Units total) by mouth every 7 (seven) days., Disp: 4 capsule, Rfl: 0   Objective:   Vitals:   02/05/23 1009  BP: 130/68  Pulse: 93  SpO2: 99%    Physical Exam Vitals reviewed.  Constitutional:      Appearance: Normal appearance.  HENT:     Head: Normocephalic.  Cardiovascular:  Rate and Rhythm: Normal rate.     Pulses: Normal pulses.  Pulmonary:     Effort: Pulmonary effort is normal.  Abdominal:     Palpations: Abdomen is soft.  Musculoskeletal:        General: No swelling or deformity.  Skin:    General: Skin is warm.     Capillary Refill: Capillary refill takes less than 2 seconds.     Coloration: Skin is not jaundiced.     Findings: No bruising.  Neurological:     Mental Status: She is alert and oriented to person, place, and time.  Psychiatric:        Mood and Affect: Mood normal.        Behavior: Behavior normal.        Thought Content: Thought content normal.        Judgment: Judgment normal.     Assessment & Plan:  Prediabetes  Morbid obesity (HCC)  Symptomatic  mammary hypertrophy  Chronic bilateral thoracic back pain  The procedure the patient selected and that was best for the patient was discussed. The risk were discussed and include but not limited to the following:  Breast asymmetry, fluid accumulation, firmness of the breast, inability to breast feed, loss of nipple or areola, skin loss, change in skin and nipple sensation, fat necrosis of the breast tissue, bleeding, infection and healing delay.  There are risks of anesthesia and injury to nerves or blood vessels.  Allergic reaction to tape, suture and skin glue are possible.  There will be swelling.  Any of these can lead to the need for revisional surgery which is not included in this surgery.  A breast reduction has potential to interfere with diagnostic procedures in the future.  This procedure is best done when the breast is fully developed.  Changes in the breast will continue to occur over time: pregnancy, weight gain or weigh loss. No guarantees are given for a certain bra or breast size.    Total time: 40 minutes. This includes time spent with the patient during the visit as well as time spent before and after the visit reviewing the chart, documenting the encounter, ordering pertinent studies and literature for the patient.   Physical therapy: Not required Mammogram: Not indicated  The patient is a candidate for bilateral breast reduction with liposuction.  We will submit to insurance and see what we can find out.  The patient may need an amputation technique.  Pictures were obtained of the patient and placed in the chart with the patient's or guardian's permission.   Alena Bills Kynzli Rease, DO

## 2023-02-05 NOTE — H&P (View-Only) (Signed)
Patient ID: Jodi Downs, female    DOB: 1995/12/30, 28 y.o.   MRN: [id number]   Chief Complaint  Patient presents with   Advice Only   Skin Problem    HPI Mammary Hyperplasia: The patient is a 28 y.o. female with a history of mammary hyperplasia for several years.  She has extremely large breasts causing symptoms that include the following: Back pain in the upper and lower back, including neck pain. She pulls or pins her bra straps to provide better lift and relief of the pressure and pain. She notices relief by holding her breast up manually.  Her shoulder straps cause grooves and pain and pressure that requires padding for relief. Pain medication is sometimes required with motrin and tylenol.  Activities that are hindered by enlarged breasts include: exercise and running.  She has tried supportive clothing as well as fitted bras without improvement.  Her breasts are extremely large and fairly symmetric.  She has hyperpigmentation of the inframammary area on both sides.  The sternal to nipple distance on the right is 41 cm and the left is 41 cm.  The IMF distance is 18 cm.  She is 5 feet 5 inches tall and weighs 257 pounds.  The BMI = 42.8 kg/m.  Preoperative bra size = K cup.  The estimated excess breast tissue to be removed at the time of surgery = 895 grams on the left and 895 grams on the right.  Mammogram history: none  Family history of breast cancer:  sister, genetic negative.  Tobacco use:  none.   The patient expresses the desire to pursue surgical intervention.  Review of Systems  Constitutional:  Positive for activity change. Negative for appetite change.  HENT: Negative.    Eyes: Negative.   Respiratory: Negative.    Cardiovascular: Negative.  Negative for leg swelling.  Gastrointestinal: Negative.   Endocrine: Negative.   Genitourinary: Negative.   Musculoskeletal:  Positive for back pain and neck pain.  Skin:  Positive for rash.    Past Medical History:   Diagnosis Date   ADD (attention deficit disorder)    Anxiety    Back pain    Depression    Frequent headaches    Joint pain    Migraines    Pre-diabetes    Vitamin D deficiency    Vitiligo     Past Surgical History:  Procedure Laterality Date   CESAREAN SECTION N/A 09/15/2021   Procedure: CESAREAN SECTION;  Surgeon: Hoover Browns, MD;  Location: [facility];  Service: Obstetrics;  Laterality: N/A;   MOUTH SURGERY Bilateral 2019   wisdom teeth removal , tooth extraction w/ bone graph 2019   TONSILLECTOMY AND ADENOIDECTOMY  2000   WISDOM TOOTH EXTRACTION        Current Outpatient Medications:    Cholecalciferol (VITAMIN D3) 50 MCG (2000 UT) CAPS, Take by mouth once a week. daily, Disp: , Rfl:    etonogestrel (NEXPLANON) 68 MG IMPL implant, 1 each by Subdermal route once., Disp: , Rfl:    Vitamin D, Ergocalciferol, (DRISDOL) 1.25 MG (50000 UNIT) CAPS capsule, Take 1 capsule (50,000 Units total) by mouth every 7 (seven) days., Disp: 4 capsule, Rfl: 0   Objective:   Vitals:   02/05/23 1009  BP: 130/68  Pulse: 93  SpO2: 99%    Physical Exam Vitals reviewed.  Constitutional:      Appearance: Normal appearance.  HENT:     Head: Normocephalic.  Cardiovascular:  Rate and Rhythm: Normal rate.     Pulses: Normal pulses.  Pulmonary:     Effort: Pulmonary effort is normal.  Abdominal:     Palpations: Abdomen is soft.  Musculoskeletal:        General: No swelling or deformity.  Skin:    General: Skin is warm.     Capillary Refill: Capillary refill takes less than 2 seconds.     Coloration: Skin is not jaundiced.     Findings: No bruising.  Neurological:     Mental Status: She is alert and oriented to person, place, and time.  Psychiatric:        Mood and Affect: Mood normal.        Behavior: Behavior normal.        Thought Content: Thought content normal.        Judgment: Judgment normal.     Assessment & Plan:  Prediabetes  Morbid obesity (HCC)  Symptomatic  mammary hypertrophy  Chronic bilateral thoracic back pain  The procedure the patient selected and that was best for the patient was discussed. The risk were discussed and include but not limited to the following:  Breast asymmetry, fluid accumulation, firmness of the breast, inability to breast feed, loss of nipple or areola, skin loss, change in skin and nipple sensation, fat necrosis of the breast tissue, bleeding, infection and healing delay.  There are risks of anesthesia and injury to nerves or blood vessels.  Allergic reaction to tape, suture and skin glue are possible.  There will be swelling.  Any of these can lead to the need for revisional surgery which is not included in this surgery.  A breast reduction has potential to interfere with diagnostic procedures in the future.  This procedure is best done when the breast is fully developed.  Changes in the breast will continue to occur over time: pregnancy, weight gain or weigh loss. No guarantees are given for a certain bra or breast size.    Total time: 40 minutes. This includes time spent with the patient during the visit as well as time spent before and after the visit reviewing the chart, documenting the encounter, ordering pertinent studies and literature for the patient.   Physical therapy: Not required Mammogram: Not indicated  The patient is a candidate for bilateral breast reduction with liposuction.  We will submit to insurance and see what we can find out.  The patient may need an amputation technique.  Pictures were obtained of the patient and placed in the chart with the patient's or guardian's permission.   Alena Bills Breindy Meadow, DO

## 2023-02-06 ENCOUNTER — Encounter (INDEPENDENT_AMBULATORY_CARE_PROVIDER_SITE_OTHER): Payer: Self-pay

## 2023-02-06 ENCOUNTER — Ambulatory Visit (INDEPENDENT_AMBULATORY_CARE_PROVIDER_SITE_OTHER): Payer: Medicaid Other | Admitting: Internal Medicine

## 2023-02-13 NOTE — Progress Notes (Signed)
Patient ID: Jodi Downs, female    DOB: 1995/07/31, 28 y.o.   MRN: [id number]  No chief complaint on file.   No diagnosis found.   History of Present Illness: Jodi Downs is a 28 y.o.  female  with a history of macromastia.  She presents for preoperative evaluation for upcoming procedure, bilateral breast reduction with possible upper suction, scheduled for 02/27/2023 with Dr. Ulice Bold.  The patient {HAS HAS [id number]} had problems with anesthesia. ***  Summary of Previous Visit: She was seen for initial consult by Dr. Ulice Bold 02/05/2023.  At that time, complained of chronic upper back and neck discomfort in the context of large breasts.  STN 41 cm on each side.  BMI equaled 42.8 kg/m.  Preoperative bra size equals K cup.  Estimated amount of tissue to be removed at time of surgery equals 895 g each side.  Job: ***  PMH Significant for: ***   Past Medical History: Allergies: No Known Allergies  Current Medications:  Current Outpatient Medications:    Cholecalciferol (VITAMIN D3) 50 MCG (2000 UT) CAPS, Take by mouth once a week. daily, Disp: , Rfl:    etonogestrel (NEXPLANON) 68 MG IMPL implant, 1 each by Subdermal route once., Disp: , Rfl:    Vitamin D, Ergocalciferol, (DRISDOL) 1.25 MG (50000 UNIT) CAPS capsule, Take 1 capsule (50,000 Units total) by mouth every 7 (seven) days., Disp: 4 capsule, Rfl: 0  Past Medical Problems: Past Medical History:  Diagnosis Date   ADD (attention deficit disorder)    Anxiety    Back pain    Depression    Frequent headaches    Joint pain    Migraines    Pre-diabetes    Vitamin D deficiency    Vitiligo     Past Surgical History: Past Surgical History:  Procedure Laterality Date   CESAREAN SECTION N/A 09/15/2021   Procedure: CESAREAN SECTION;  Surgeon: Hoover Browns, MD;  Location: [facility];  Service: Obstetrics;  Laterality: N/A;   MOUTH SURGERY Bilateral 2019   wisdom teeth removal , tooth extraction w/ bone graph 2019    TONSILLECTOMY AND ADENOIDECTOMY  2000   WISDOM TOOTH EXTRACTION      Social History: Social History   Socioeconomic History   Marital status: Single    Spouse name: Not on file   Number of children: Not on file   Years of education: Not on file   Highest education level: Not on file  Occupational History   Occupation: Consulting civil engineer  Tobacco Use   Smoking status: Never   Smokeless tobacco: Never  Vaping Use   Vaping status: Never Used  Substance and Sexual Activity   Alcohol use: Not Currently    Comment: social   Drug use: No   Sexual activity: Not Currently    Birth control/protection: Condom  Other Topics Concern   Not on file  Social History Narrative   Not on file   Social Drivers of Health   Financial Resource Strain: Not on file  Food Insecurity: Not on file  Transportation Needs: Not on file  Physical Activity: Not on file  Stress: Not on file  Social Connections: Not on file  Intimate Partner Violence: Not on file    Family History: Family History  Problem Relation Age of Onset   Asthma Mother    Hyperlipidemia Mother    Hypertension Mother    Sleep apnea Mother    Heart disease Father    Hyperlipidemia Father  Hypertension Father    Alcohol abuse Father    Learning disabilities Brother    Intellectual disability Brother    Diabetes Maternal Grandmother    Stroke Maternal Grandmother    Diabetes Paternal Grandfather    Diabetes Maternal Aunt    Cancer Neg Hx     Review of Systems: ROS  Physical Exam: Vital Signs There were no vitals taken for this visit.  Physical Exam *** Constitutional:      General: Not in acute distress.    Appearance: Normal appearance. Not ill-appearing.  HENT:     Head: Normocephalic and atraumatic.  Eyes:     Pupils: Pupils are equal, round. Cardiovascular:     Rate and Rhythm: Normal rate.    Pulses: Normal pulses.  Pulmonary:     Effort: No respiratory distress or increased work of breathing.  Speaks in  full sentences. Abdominal:     General: Abdomen is flat. No distension.   Musculoskeletal: Normal range of motion. No lower extremity swelling or edema. No varicosities. *** Skin:    General: Skin is warm and dry.     Findings: No erythema or rash.  Neurological:     Mental Status: Alert and oriented to person, place, and time.  Psychiatric:        Mood and Affect: Mood normal.        Behavior: Behavior normal.    Assessment/Plan: The patient is scheduled for *** with Dr. Jenean Lindau.  Risks, benefits, and alternatives of procedure discussed, questions answered and consent obtained.    Smoking Status: ***; Counseling Given? *** Last Mammogram: ***; Results: ***  Caprini Score: ***; Risk Factors include: ***, BMI *** 25, and length of planned surgery. Recommendation for mechanical *** pharmacological prophylaxis. Encourage early ambulation.   Pictures obtained: ***  Post-op Rx sent to pharmacy: ***  Patient was provided with the *** General Surgical Risk consent document and Pain Medication Agreement prior to their appointment.  They had adequate time to read through the risk consent documents and Pain Medication Agreement. We also discussed them in person together during this preop appointment. All of their questions were answered to their satisfaction.  Recommended calling if they have any further questions.  Risk consent form and Pain Medication Agreement to be scanned into patient's chart.  ***   Electronically signed by: Evelena Leyden, PA-C 02/13/2023 4:12 PM

## 2023-02-13 NOTE — H&P (View-Only) (Signed)
Patient ID: Jodi Downs, female    DOB: 07-May-1995, 28 y.o.   MRN: [id number]  No chief complaint on file.   No diagnosis found.   History of Present Illness: Jodi Downs is a 28 y.o.  female  with a history of macromastia.  She presents for preoperative evaluation for upcoming procedure, bilateral breast reduction with possible upper suction, scheduled for 02/27/2023 with Dr. Ulice Bold.  The patient {HAS HAS [id number]} had problems with anesthesia. ***  Summary of Previous Visit: She was seen for initial consult by Dr. Ulice Bold 02/05/2023.  At that time, complained of chronic upper back and neck discomfort in the context of large breasts.  STN 41 cm on each side.  BMI equaled 42.8 kg/m.  Preoperative bra size equals K cup.  Estimated amount of tissue to be removed at time of surgery equals 895 g each side.  Job: ***  PMH Significant for: ***   Past Medical History: Allergies: No Known Allergies  Current Medications:  Current Outpatient Medications:    Cholecalciferol (VITAMIN D3) 50 MCG (2000 UT) CAPS, Take by mouth once a week. daily, Disp: , Rfl:    etonogestrel (NEXPLANON) 68 MG IMPL implant, 1 each by Subdermal route once., Disp: , Rfl:    Vitamin D, Ergocalciferol, (DRISDOL) 1.25 MG (50000 UNIT) CAPS capsule, Take 1 capsule (50,000 Units total) by mouth every 7 (seven) days., Disp: 4 capsule, Rfl: 0  Past Medical Problems: Past Medical History:  Diagnosis Date   ADD (attention deficit disorder)    Anxiety    Back pain    Depression    Frequent headaches    Joint pain    Migraines    Pre-diabetes    Vitamin D deficiency    Vitiligo     Past Surgical History: Past Surgical History:  Procedure Laterality Date   CESAREAN SECTION N/A 09/15/2021   Procedure: CESAREAN SECTION;  Surgeon: Hoover Browns, MD;  Location: [facility];  Service: Obstetrics;  Laterality: N/A;   MOUTH SURGERY Bilateral 2019   wisdom teeth removal , tooth extraction w/ bone graph 2019    TONSILLECTOMY AND ADENOIDECTOMY  2000   WISDOM TOOTH EXTRACTION      Social History: Social History   Socioeconomic History   Marital status: Single    Spouse name: Not on file   Number of children: Not on file   Years of education: Not on file   Highest education level: Not on file  Occupational History   Occupation: Consulting civil engineer  Tobacco Use   Smoking status: Never   Smokeless tobacco: Never  Vaping Use   Vaping status: Never Used  Substance and Sexual Activity   Alcohol use: Not Currently    Comment: social   Drug use: No   Sexual activity: Not Currently    Birth control/protection: Condom  Other Topics Concern   Not on file  Social History Narrative   Not on file   Social Drivers of Health   Financial Resource Strain: Not on file  Food Insecurity: Not on file  Transportation Needs: Not on file  Physical Activity: Not on file  Stress: Not on file  Social Connections: Not on file  Intimate Partner Violence: Not on file    Family History: Family History  Problem Relation Age of Onset   Asthma Mother    Hyperlipidemia Mother    Hypertension Mother    Sleep apnea Mother    Heart disease Father    Hyperlipidemia Father  Hypertension Father    Alcohol abuse Father    Learning disabilities Brother    Intellectual disability Brother    Diabetes Maternal Grandmother    Stroke Maternal Grandmother    Diabetes Paternal Grandfather    Diabetes Maternal Aunt    Cancer Neg Hx     Review of Systems: ROS  Physical Exam: Vital Signs There were no vitals taken for this visit.  Physical Exam *** Constitutional:      General: Not in acute distress.    Appearance: Normal appearance. Not ill-appearing.  HENT:     Head: Normocephalic and atraumatic.  Eyes:     Pupils: Pupils are equal, round. Cardiovascular:     Rate and Rhythm: Normal rate.    Pulses: Normal pulses.  Pulmonary:     Effort: No respiratory distress or increased work of breathing.  Speaks in  full sentences. Abdominal:     General: Abdomen is flat. No distension.   Musculoskeletal: Normal range of motion. No lower extremity swelling or edema. No varicosities. *** Skin:    General: Skin is warm and dry.     Findings: No erythema or rash.  Neurological:     Mental Status: Alert and oriented to person, place, and time.  Psychiatric:        Mood and Affect: Mood normal.        Behavior: Behavior normal.    Assessment/Plan: The patient is scheduled for *** with Dr. Jenean Lindau.  Risks, benefits, and alternatives of procedure discussed, questions answered and consent obtained.    Smoking Status: ***; Counseling Given? *** Last Mammogram: ***; Results: ***  Caprini Score: ***; Risk Factors include: ***, BMI *** 25, and length of planned surgery. Recommendation for mechanical *** pharmacological prophylaxis. Encourage early ambulation.   Pictures obtained: ***  Post-op Rx sent to pharmacy: ***  Patient was provided with the *** General Surgical Risk consent document and Pain Medication Agreement prior to their appointment.  They had adequate time to read through the risk consent documents and Pain Medication Agreement. We also discussed them in person together during this preop appointment. All of their questions were answered to their satisfaction.  Recommended calling if they have any further questions.  Risk consent form and Pain Medication Agreement to be scanned into patient's chart.  ***   Electronically signed by: Evelena Leyden, PA-C 02/13/2023 4:12 PM

## 2023-02-14 ENCOUNTER — Ambulatory Visit (INDEPENDENT_AMBULATORY_CARE_PROVIDER_SITE_OTHER): Payer: Medicaid Other | Admitting: Physician Assistant

## 2023-02-14 ENCOUNTER — Encounter: Payer: Self-pay | Admitting: Physician Assistant

## 2023-02-14 VITALS — BP 129/77 | HR 94 | Ht 65.0 in | Wt 264.6 lb

## 2023-02-14 DIAGNOSIS — N62 Hypertrophy of breast: Secondary | ICD-10-CM

## 2023-02-14 MED ORDER — CEPHALEXIN 500 MG PO CAPS: 500.0000 mg | ORAL_CAPSULE | Freq: Four times a day (QID) | ORAL | 0 refills | Status: AC

## 2023-02-14 MED ORDER — OXYCODONE HCL 5 MG PO TABS: 5.0000 mg | ORAL_TABLET | Freq: Three times a day (TID) | ORAL | 0 refills | Status: AC | PRN

## 2023-02-14 MED ORDER — ONDANSETRON 4 MG PO TBDP: 4.0000 mg | ORAL_TABLET | Freq: Three times a day (TID) | ORAL | 0 refills | Status: DC | PRN

## 2023-02-16 DIAGNOSIS — Z419 Encounter for procedure for purposes other than remedying health state, unspecified: Secondary | ICD-10-CM | POA: Diagnosis not present

## 2023-02-19 ENCOUNTER — Encounter (HOSPITAL_BASED_OUTPATIENT_CLINIC_OR_DEPARTMENT_OTHER): Payer: Self-pay | Admitting: *Deleted

## 2023-02-19 ENCOUNTER — Other Ambulatory Visit: Payer: Self-pay

## 2023-02-26 ENCOUNTER — Other Ambulatory Visit: Payer: Self-pay

## 2023-02-26 ENCOUNTER — Telehealth: Payer: Self-pay | Admitting: Plastic Surgery

## 2023-02-26 MED ORDER — OXYCODONE HCL 5 MG PO TABS: 5.0000 mg | ORAL_TABLET | Freq: Three times a day (TID) | ORAL | 0 refills | Status: AC | PRN

## 2023-02-26 NOTE — Telephone Encounter (Signed)
Done. Attempted to call patient to let her know, but there was no answer. She should be able to pick it up today.

## 2023-02-26 NOTE — Progress Notes (Signed)
SDW CALL  Patient was given pre-op instructions over the phone. The opportunity was given for the patient to ask questions. No further questions asked. Patient verbalized understanding of instructions given.  Patient's procedure moved from [facility] to [facility]  Patient given new arrival instructions.  Patient had no further questions.    PCP - Dr. Hoover Browns Cardiologist - denies  PPM/ICD - denies Device Orders - n/a Rep Notified - n/a  Chest x-ray - denies EKG - 11/28/22 Stress Test - denies ECHO - denies Cardiac Cath - denies  Sleep Study - denies CPAP - n/a  NO DM  Last dose of GLP1 agonist-  n/a GLP1 instructions: n/a  Blood Thinner Instructions: n/a Aspirin Instructions: n/a  ERAS Protcol - NPO   COVID TEST- n/a   Anesthesia review: no  Patient denies shortness of breath, fever, cough and chest pain over the phone call   All instructions explained to the patient, with a verbal understanding of the material. Patient agrees to go over the instructions while at home for a better understanding.

## 2023-02-26 NOTE — Telephone Encounter (Signed)
Patient states that insurance wasn't covering meds and finally fixed it. Her current pharmacy was able to give her the Zofran, but was unable to get the oxy and needs it sent to another pharmacy, [facility] on [address]. Please reach out.

## 2023-02-26 NOTE — Addendum Note (Signed)
Addended by: Evelena Leyden on: 02/26/2023 10:18 AM   Modules accepted: Orders

## 2023-02-27 ENCOUNTER — Other Ambulatory Visit: Payer: Self-pay

## 2023-02-27 ENCOUNTER — Ambulatory Visit (HOSPITAL_BASED_OUTPATIENT_CLINIC_OR_DEPARTMENT_OTHER): Payer: Medicaid Other

## 2023-02-27 ENCOUNTER — Encounter (HOSPITAL_COMMUNITY): Payer: Self-pay | Admitting: Plastic Surgery

## 2023-02-27 ENCOUNTER — Ambulatory Visit (HOSPITAL_COMMUNITY)
Admission: RE | Admit: 2023-02-27 | Discharge: 2023-02-27 | Disposition: A | Discharge status: Home / Self Care | Payer: Medicaid Other | Attending: Plastic Surgery | Admitting: Plastic Surgery

## 2023-02-27 ENCOUNTER — Encounter (HOSPITAL_COMMUNITY)
Admission: RE | Disposition: A | Discharge status: Home / Self Care | Payer: Self-pay | Source: Home / Self Care | Attending: Plastic Surgery

## 2023-02-27 ENCOUNTER — Ambulatory Visit (HOSPITAL_COMMUNITY): Payer: Medicaid Other

## 2023-02-27 DIAGNOSIS — M542 Cervicalgia: Secondary | ICD-10-CM | POA: Insufficient documentation

## 2023-02-27 DIAGNOSIS — E66813 Obesity, class 3: Secondary | ICD-10-CM | POA: Diagnosis not present

## 2023-02-27 DIAGNOSIS — F418 Other specified anxiety disorders: Secondary | ICD-10-CM

## 2023-02-27 DIAGNOSIS — M549 Dorsalgia, unspecified: Secondary | ICD-10-CM | POA: Diagnosis not present

## 2023-02-27 DIAGNOSIS — Z6841 Body Mass Index (BMI) 40.0 and over, adult: Secondary | ICD-10-CM

## 2023-02-27 DIAGNOSIS — N62 Hypertrophy of breast: Secondary | ICD-10-CM | POA: Diagnosis not present

## 2023-02-27 DIAGNOSIS — E785 Hyperlipidemia, unspecified: Secondary | ICD-10-CM | POA: Diagnosis not present

## 2023-02-27 DIAGNOSIS — Z01818 Encounter for other preprocedural examination: Secondary | ICD-10-CM

## 2023-02-27 HISTORY — PX: BREAST REDUCTION SURGERY: SHX8

## 2023-02-27 LAB — BASIC METABOLIC PANEL
Anion gap: 12 (ref 5–15)
BUN: 8 mg/dL (ref 6–20)
CO2: 21 mmol/L — ABNORMAL LOW (ref 22–32)
Calcium: 8.7 mg/dL — ABNORMAL LOW (ref 8.9–10.3)
Chloride: 103 mmol/L (ref 98–111)
Creatinine, Ser: 0.77 mg/dL (ref 0.44–1.00)
GFR, Estimated: >60 mL/min (ref >60)
Glucose, Bld: 90 mg/dL (ref 70–99)
Potassium: 3.6 mmol/L (ref 3.5–5.1)
Sodium: 136 mmol/L (ref 135–145)

## 2023-02-27 LAB — CBC
HCT: 41.4 % (ref 36.0–46.0)
Hemoglobin: 12.9 g/dL (ref 12.0–15.0)
MCH: 27.5 pg (ref 26.0–34.0)
MCHC: 31.2 g/dL (ref 30.0–36.0)
MCV: 88.3 fL (ref 80.0–100.0)
Platelets: 298 10*3/uL (ref 150–400)
RBC: 4.69 MIL/uL (ref 3.87–5.11)
RDW: 14 % (ref 11.5–15.5)
WBC: 6.1 10*3/uL (ref 4.0–10.5)
nRBC: 0 % (ref 0.0–0.2)

## 2023-02-27 LAB — POCT PREGNANCY, URINE: Preg Test, Ur: NEGATIVE

## 2023-02-27 SURGERY — BREAST REDUCTION WITH LIPOSUCTION
Anesthesia: General | Site: Breast | Laterality: Bilateral

## 2023-02-27 MED ORDER — ONDANSETRON HCL 4 MG/2ML IJ SOLN
INTRAMUSCULAR | Status: DC | PRN
  Administered 2023-02-27: 4 mg via INTRAVENOUS

## 2023-02-27 MED ORDER — HYDROMORPHONE HCL 1 MG/ML IJ SOLN
INTRAMUSCULAR | Status: DC | PRN
  Administered 2023-02-27: .5 mg via INTRAVENOUS

## 2023-02-27 MED ORDER — ACETAMINOPHEN 500 MG PO TABS
1000.0000 mg | ORAL_TABLET | Freq: Once | ORAL | Status: AC
  Administered 2023-02-27: 1000 mg via ORAL
  Filled 2023-02-27: qty 2

## 2023-02-27 MED ORDER — NEOSTIGMINE METHYLSULFATE 10 MG/10ML IV SOLN
INTRAVENOUS | Status: DC | PRN
  Administered 2023-02-27 (×3): 1 mg via INTRAVENOUS

## 2023-02-27 MED ORDER — HYDROMORPHONE HCL 1 MG/ML IJ SOLN
INTRAMUSCULAR | Status: AC
  Filled 2023-02-27: qty 1

## 2023-02-27 MED ORDER — LIDOCAINE HCL (PF) 1 % IJ SOLN
INTRAMUSCULAR | Status: DC | PRN
  Administered 2023-02-27: 30 mL

## 2023-02-27 MED ORDER — LIDOCAINE HCL (PF) 1 % IJ SOLN
INTRAMUSCULAR | Status: AC
  Filled 2023-02-27: qty 30

## 2023-02-27 MED ORDER — CHLORHEXIDINE GLUCONATE CLOTH 2 % EX PADS
6.0000 | MEDICATED_PAD | Freq: Once | CUTANEOUS | Status: DC
Start: 2023-02-27 — End: 2023-02-27

## 2023-02-27 MED ORDER — ROCURONIUM BROMIDE 10 MG/ML (PF) SYRINGE
PREFILLED_SYRINGE | INTRAVENOUS | Status: AC
  Filled 2023-02-27: qty 10

## 2023-02-27 MED ORDER — ONDANSETRON HCL 4 MG/2ML IJ SOLN: INTRAMUSCULAR | Status: DC | PRN

## 2023-02-27 MED ORDER — FENTANYL CITRATE (PF) 100 MCG/2ML IJ SOLN: 25.0000 ug | INTRAMUSCULAR | Status: DC | PRN

## 2023-02-27 MED ORDER — GLYCOPYRROLATE 0.2 MG/ML IJ SOLN
INTRAMUSCULAR | Status: DC | PRN
  Administered 2023-02-27 (×3): .2 mg via INTRAVENOUS

## 2023-02-27 MED ORDER — TRANEXAMIC ACID-NACL 1000-0.7 MG/100ML-% IV SOLN
INTRAVENOUS | Status: DC | PRN
Start: 2023-02-27 — End: 2023-02-27
  Administered 2023-02-27: 1000 mg via INTRAVENOUS

## 2023-02-27 MED ORDER — LACTATED RINGERS IV SOLN: INTRAVENOUS | Status: DC

## 2023-02-27 MED ORDER — MIDAZOLAM HCL 2 MG/2ML IJ SOLN
INTRAMUSCULAR | Status: DC | PRN
  Administered 2023-02-27: 2 mg via INTRAVENOUS

## 2023-02-27 MED ORDER — PHENYLEPHRINE HCL-NACL 20-0.9 MG/250ML-% IV SOLN
INTRAVENOUS | Status: DC | PRN
  Administered 2023-02-27: 20 ug/min via INTRAVENOUS

## 2023-02-27 MED ORDER — KETAMINE HCL 50 MG/5ML IJ SOSY
PREFILLED_SYRINGE | INTRAMUSCULAR | Status: AC
  Filled 2023-02-27: qty 5

## 2023-02-27 MED ORDER — EPINEPHRINE 1 MG/ML IJ SOLN
INTRAMUSCULAR | Status: DC | PRN
  Administered 2023-02-27: 1 mg via SUBCUTANEOUS

## 2023-02-27 MED ORDER — CHLORHEXIDINE GLUCONATE 0.12 % MT SOLN
15.0000 mL | Freq: Once | OROMUCOSAL | Status: AC
  Administered 2023-02-27: 15 mL via OROMUCOSAL

## 2023-02-27 MED ORDER — ROCURONIUM BROMIDE 10 MG/ML (PF) SYRINGE
PREFILLED_SYRINGE | INTRAVENOUS | Status: DC | PRN
Start: 2023-02-27 — End: 2023-02-27
  Administered 2023-02-27: 10 mg via INTRAVENOUS
  Administered 2023-02-27: 50 mg via INTRAVENOUS

## 2023-02-27 MED ORDER — CHLORHEXIDINE GLUCONATE 0.12 % MT SOLN
OROMUCOSAL | Status: AC
  Filled 2023-02-27: qty 15

## 2023-02-27 MED ORDER — BUPIVACAINE HCL (PF) 0.25 % IJ SOLN
INTRAMUSCULAR | Status: DC | PRN
  Administered 2023-02-27: 30 mL

## 2023-02-27 MED ORDER — PROPOFOL 10 MG/ML IV BOLUS
INTRAVENOUS | Status: DC | PRN
Start: 2023-02-27 — End: 2023-02-27
  Administered 2023-02-27: 150 mg via INTRAVENOUS

## 2023-02-27 MED ORDER — KETAMINE HCL 10 MG/ML IJ SOLN
INTRAMUSCULAR | Status: DC | PRN
  Administered 2023-02-27: 10 mg via INTRAVENOUS
  Administered 2023-02-27: 30 mg via INTRAVENOUS
  Administered 2023-02-27: 10 mg via INTRAVENOUS

## 2023-02-27 MED ORDER — MIDAZOLAM HCL 2 MG/2ML IJ SOLN
INTRAMUSCULAR | Status: AC
  Filled 2023-02-27: qty 2

## 2023-02-27 MED ORDER — LIDOCAINE 2% (20 MG/ML) 5 ML SYRINGE
INTRAMUSCULAR | Status: DC | PRN
Start: 2023-02-27 — End: 2023-02-27
  Administered 2023-02-27: 100 mg via INTRAVENOUS

## 2023-02-27 MED ORDER — BUPIVACAINE HCL (PF) 0.25 % IJ SOLN
INTRAMUSCULAR | Status: AC
  Filled 2023-02-27: qty 30

## 2023-02-27 MED ORDER — DEXMEDETOMIDINE HCL IN NACL 80 MCG/20ML IV SOLN
INTRAVENOUS | Status: DC | PRN
  Administered 2023-02-27: 8 ug via INTRAVENOUS

## 2023-02-27 MED ORDER — LIDOCAINE-EPINEPHRINE 1 %-1:100000 IJ SOLN
INTRAMUSCULAR | Status: DC | PRN
  Administered 2023-02-27: 20 mL

## 2023-02-27 MED ORDER — DEXAMETHASONE SODIUM PHOSPHATE 10 MG/ML IJ SOLN
INTRAMUSCULAR | Status: AC
  Filled 2023-02-27: qty 1

## 2023-02-27 MED ORDER — ACETAMINOPHEN 325 MG PO TABS: 650.0000 mg | ORAL_TABLET | ORAL | Status: DC | PRN

## 2023-02-27 MED ORDER — OXYCODONE HCL 5 MG PO TABS
5.0000 mg | ORAL_TABLET | ORAL | Status: DC | PRN
  Administered 2023-02-27: 5 mg via ORAL

## 2023-02-27 MED ORDER — ACETAMINOPHEN 650 MG RE SUPP: 650.0000 mg | RECTAL | Status: DC | PRN

## 2023-02-27 MED ORDER — LACTATED RINGERS IR SOLN
Status: DC | PRN
  Administered 2023-02-27: 1000 mL

## 2023-02-27 MED ORDER — DEXAMETHASONE SODIUM PHOSPHATE 10 MG/ML IJ SOLN
INTRAMUSCULAR | Status: DC | PRN
  Administered 2023-02-27: 5 mg via INTRAVENOUS

## 2023-02-27 MED ORDER — LACTATED RINGERS IV SOLN
Freq: Once | INTRAVENOUS | Status: DC
  Filled 2023-02-27 (×2): qty 30

## 2023-02-27 MED ORDER — EPINEPHRINE PF 1 MG/ML IJ SOLN
INTRAMUSCULAR | Status: AC
  Filled 2023-02-27: qty 1

## 2023-02-27 MED ORDER — PROPOFOL 10 MG/ML IV BOLUS
INTRAVENOUS | Status: AC
  Filled 2023-02-27: qty 20

## 2023-02-27 MED ORDER — FENTANYL CITRATE (PF) 250 MCG/5ML IJ SOLN
INTRAMUSCULAR | Status: DC | PRN
  Administered 2023-02-27 (×5): 50 ug via INTRAVENOUS

## 2023-02-27 MED ORDER — SODIUM CHLORIDE 0.9% FLUSH: 3.0000 mL | Freq: Two times a day (BID) | INTRAVENOUS | Status: DC

## 2023-02-27 MED ORDER — SODIUM CHLORIDE 0.9 % IV SOLN: 250.0000 mL | INTRAVENOUS | Status: DC | PRN

## 2023-02-27 MED ORDER — LACTATED RINGERS IV SOLN
INTRAVENOUS | Status: DC
Start: 2023-02-27 — End: 2023-02-27

## 2023-02-27 MED ORDER — CEFAZOLIN SODIUM-DEXTROSE 2-4 GM/100ML-% IV SOLN
2.0000 g | INTRAVENOUS | Status: AC
  Administered 2023-02-27: 3 g via INTRAVENOUS
  Filled 2023-02-27: qty 100

## 2023-02-27 MED ORDER — TRANEXAMIC ACID-NACL 1000-0.7 MG/100ML-% IV SOLN
INTRAVENOUS | Status: AC
  Filled 2023-02-27: qty 100

## 2023-02-27 MED ORDER — HYDROMORPHONE HCL 1 MG/ML IJ SOLN
0.2500 mg | INTRAMUSCULAR | Status: DC | PRN
  Administered 2023-02-27: 0.5 mg via INTRAVENOUS

## 2023-02-27 MED ORDER — SODIUM CHLORIDE 0.9% FLUSH
3.0000 mL | INTRAVENOUS | Status: DC | PRN
Start: 2023-02-27 — End: 2023-02-27

## 2023-02-27 MED ORDER — ONDANSETRON HCL 4 MG/2ML IJ SOLN
INTRAMUSCULAR | Status: AC
  Filled 2023-02-27: qty 2

## 2023-02-27 MED ORDER — LIDOCAINE-EPINEPHRINE 1 %-1:100000 IJ SOLN
INTRAMUSCULAR | Status: AC
  Filled 2023-02-27: qty 1

## 2023-02-27 MED ORDER — SODIUM CHLORIDE 0.9 % IV SOLN
INTRAVENOUS | Status: DC | PRN
  Administered 2023-02-27: 40 mL

## 2023-02-27 MED ORDER — OXYCODONE HCL 5 MG PO TABS
ORAL_TABLET | ORAL | Status: AC
  Filled 2023-02-27: qty 1

## 2023-02-27 MED ORDER — PHENYLEPHRINE 80 MCG/ML (10ML) SYRINGE FOR IV PUSH (FOR BLOOD PRESSURE SUPPORT)
PREFILLED_SYRINGE | INTRAVENOUS | Status: DC | PRN
Start: 2023-02-27 — End: 2023-02-27
  Administered 2023-02-27: 160 ug via INTRAVENOUS
  Administered 2023-02-27 (×3): 80 ug via INTRAVENOUS
  Administered 2023-02-27: 160 ug via INTRAVENOUS
  Administered 2023-02-27 (×2): 80 ug via INTRAVENOUS

## 2023-02-27 MED ORDER — HYDROMORPHONE HCL 1 MG/ML IJ SOLN
INTRAMUSCULAR | Status: AC
  Filled 2023-02-27: qty 0.5

## 2023-02-27 MED ORDER — LIDOCAINE 2% (20 MG/ML) 5 ML SYRINGE
INTRAMUSCULAR | Status: AC
  Filled 2023-02-27: qty 5

## 2023-02-27 MED ORDER — FENTANYL CITRATE (PF) 250 MCG/5ML IJ SOLN
INTRAMUSCULAR | Status: AC
  Filled 2023-02-27: qty 5

## 2023-02-27 MED ORDER — ORAL CARE MOUTH RINSE: 15.0000 mL | Freq: Once | OROMUCOSAL | Status: AC

## 2023-02-27 SURGICAL SUPPLY — 60 items
BAG COUNTER SPONGE SURGICOUNT (BAG) ×1 IMPLANT
BAG DECANTER FOR FLEXI CONT (MISCELLANEOUS) IMPLANT
BINDER BREAST LRG (GAUZE/BANDAGES/DRESSINGS) IMPLANT
BINDER BREAST XLRG (GAUZE/BANDAGES/DRESSINGS) IMPLANT
BINDER BREAST XXLRG (GAUZE/BANDAGES/DRESSINGS) IMPLANT
BIOPATCH RED 1 DISK 7.0 (GAUZE/BANDAGES/DRESSINGS) IMPLANT
BLADE SURG 10 STRL SS (BLADE) ×1 IMPLANT
BNDG GAUZE DERMACEA FLUFF 4 (GAUZE/BANDAGES/DRESSINGS) IMPLANT
CANISTER SUCT 3000ML PPV (MISCELLANEOUS) ×1 IMPLANT
CHLORAPREP W/TINT 26 (MISCELLANEOUS) ×1 IMPLANT
COLLAGEN CELLERATERX 5 GRAM (Miscellaneous) IMPLANT
COTTONBALL LRG STERILE PKG (GAUZE/BANDAGES/DRESSINGS) IMPLANT
DERMABOND ADVANCED .7 DNX12 (GAUZE/BANDAGES/DRESSINGS) ×3 IMPLANT
DRAIN CHANNEL 15F RND FF W/TCR (WOUND CARE) IMPLANT
DRAIN CHANNEL 19F RND (DRAIN) IMPLANT
DRAPE CHEST BREAST 15X10 FENES (DRAPES) ×1 IMPLANT
DRAPE SURG ORHT 6 SPLT 77X108 (DRAPES) ×2 IMPLANT
DRSG MEPILEX POST OP 4X8 (GAUZE/BANDAGES/DRESSINGS) ×2 IMPLANT
DRSG TEGADERM 4X4.75 (GAUZE/BANDAGES/DRESSINGS) IMPLANT
DRSG WOUND ADHESIVE SYLKE 050 (GAUZE/BANDAGES/DRESSINGS) IMPLANT
ELECT BLADE 4.0 EZ CLEAN MEGAD (MISCELLANEOUS) ×2 IMPLANT
ELECT CAUTERY BLADE 6.4 (BLADE) ×1 IMPLANT
ELECT REM PT RETURN 9FT ADLT (ELECTROSURGICAL) ×1 IMPLANT
ELECTRODE BLDE 4.0 EZ CLN MEGD (MISCELLANEOUS) ×2 IMPLANT
ELECTRODE REM PT RTRN 9FT ADLT (ELECTROSURGICAL) ×1 IMPLANT
EVACUATOR SILICONE 100CC (DRAIN) IMPLANT
GAUZE PAD ABD 8X10 STRL (GAUZE/BANDAGES/DRESSINGS) ×2 IMPLANT
GAUZE XEROFORM 5X9 LF (GAUZE/BANDAGES/DRESSINGS) IMPLANT
GLOVE BIO SURGEON STRL SZ 6.5 (GLOVE) ×2 IMPLANT
GLOVE SURG ENC TEXT LTX SZ7.5 (GLOVE) IMPLANT
GOWN STRL REUS W/ TWL LRG LVL3 (GOWN DISPOSABLE) ×2 IMPLANT
HEMOSTAT ARISTA ABSORB 3G PWDR (HEMOSTASIS) IMPLANT
MARKER SKIN DUAL TIP RULER LAB (MISCELLANEOUS) ×1 IMPLANT
NDL HYPO 25GX1X1/2 BEV (NEEDLE) ×1 IMPLANT
NEEDLE HYPO 25GX1X1/2 BEV (NEEDLE) ×1 IMPLANT
NS IRRIG 1000ML POUR BTL (IV SOLUTION) ×1 IMPLANT
PACK GENERAL/GYN (CUSTOM PROCEDURE TRAY) ×1 IMPLANT
PAD FOAM SILICONE BACKED (GAUZE/BANDAGES/DRESSINGS) IMPLANT
PIN SAFETY STERILE (MISCELLANEOUS) IMPLANT
SPIKE FLUID TRANSFER (MISCELLANEOUS) IMPLANT
SPONGE T-LAP 18X18 ~~LOC~~+RFID (SPONGE) ×2 IMPLANT
STAPLER VISISTAT 35W (STAPLE) IMPLANT
STRIP CLOSURE SKIN 1/2X4 (GAUZE/BANDAGES/DRESSINGS) ×2 IMPLANT
SUT MNCRL AB 3-0 PS2 27 (SUTURE) IMPLANT
SUT MNCRL AB 4-0 PS2 18 (SUTURE) IMPLANT
SUT MON AB 3-0 SH27 (SUTURE) IMPLANT
SUT MON AB 4-0 PS1 27 (SUTURE) IMPLANT
SUT MON AB 5-0 PS2 18 (SUTURE) IMPLANT
SUT PDS AB 2-0 CT2 27 (SUTURE) IMPLANT
SUT PDS AB 3-0 SH 27 (SUTURE) IMPLANT
SUT SILK 2 0 SH (SUTURE) IMPLANT
SUT SILK 3 0 PS 1 (SUTURE) IMPLANT
SUT SILK 4 0 PS 2 (SUTURE) IMPLANT
SUT VIC AB 3-0 SH 27X BRD (SUTURE) IMPLANT
SUT VIC AB 4-0 PS2 18 (SUTURE) IMPLANT
SUT VICRYL 4-0 PS2 18IN ABS (SUTURE) IMPLANT
SYR 50ML LL SCALE MARK (SYRINGE) IMPLANT
SYR CONTROL 10ML LL (SYRINGE) ×1 IMPLANT
TOWEL GREEN STERILE FF (TOWEL DISPOSABLE) ×2 IMPLANT
TUBING INFILTRATION IT-10001 (TUBING) ×1 IMPLANT

## 2023-02-27 NOTE — Interval H&P Note (Signed)
History and Physical Interval Note:  02/27/2023 12:07 PM  Jodi Downs  has presented today for surgery, with the diagnosis of macromastia.  The various methods of treatment have been discussed with the patient and family. After consideration of risks, benefits and other options for treatment, the patient has consented to  Procedure(s): BREAST REDUCTION WITH LIPOSUCTION (Bilateral) as a surgical intervention.  The patient's history has been reviewed, patient examined, no change in status, stable for surgery.  I have reviewed the patient's chart and labs.  Questions were answered to the patient's satisfaction.     Alena Bills Govani Radloff

## 2023-02-27 NOTE — Discharge Instructions (Signed)

## 2023-02-27 NOTE — Anesthesia Preprocedure Evaluation (Addendum)
Anesthesia Evaluation  Patient identified by MRN, date of birth, ID band Patient awake    Reviewed: Allergy & Precautions, NPO status , Patient's Chart, lab work & pertinent test results  Airway Mallampati: III  TM Distance: >3 FB Neck ROM: Full    Dental no notable dental hx. (+) Teeth Intact, Dental Advisory Given   Pulmonary neg pulmonary ROS   Pulmonary exam normal breath sounds clear to auscultation       Cardiovascular negative cardio ROS Normal cardiovascular exam Rhythm:Regular Rate:Normal     Neuro/Psych  Headaches PSYCHIATRIC DISORDERS Anxiety Depression       GI/Hepatic negative GI ROS, Neg liver ROS,,,  Endo/Other    Class 3 obesity (BMI 44)  Renal/GU negative Renal ROS  negative genitourinary   Musculoskeletal negative musculoskeletal ROS (+)    Abdominal   Peds  (+) ATTENTION DEFICIT DISORDER WITHOUT HYPERACTIVITY Hematology negative hematology ROS (+)   Anesthesia Other Findings   Reproductive/Obstetrics                             Anesthesia Physical Anesthesia Plan  ASA: 3  Anesthesia Plan: General   Post-op Pain Management: Tylenol PO (pre-op)*, Ketamine IV* and Dilaudid IV   Induction: Intravenous  PONV Risk Score and Plan: 3 and Midazolam, Dexamethasone and Ondansetron  Airway Management Planned: Oral ETT  Additional Equipment:   Intra-op Plan:   Post-operative Plan: Extubation in OR  Informed Consent: I have reviewed the patients History and Physical, chart, labs and discussed the procedure including the risks, benefits and alternatives for the proposed anesthesia with the patient or authorized representative who has indicated his/her understanding and acceptance.     Dental advisory given  Plan Discussed with: CRNA  Anesthesia Plan Comments:        Anesthesia Quick Evaluation

## 2023-02-27 NOTE — Anesthesia Procedure Notes (Signed)
Procedure Name: Intubation Date/Time: 02/27/2023 12:41 PM  Performed by: Chari Manning, RNPre-anesthesia Checklist: Patient identified, Emergency Drugs available, Suction available and Patient being monitored Patient Re-evaluated:Patient Re-evaluated prior to induction Oxygen Delivery Method: Circle System Utilized Preoxygenation: Pre-oxygenation with 100% oxygen Induction Type: IV induction Ventilation: Mask ventilation without difficulty Laryngoscope Size: Mac and 3 Grade View: Grade I Tube type: Oral Tube size: 7.0 mm Number of attempts: 1 Airway Equipment and Method: Stylet Placement Confirmation: ETT inserted through vocal cords under direct vision, positive ETCO2 and breath sounds checked- equal and bilateral Secured at: 22 cm Tube secured with: Tape Dental Injury: Teeth and Oropharynx as per pre-operative assessment

## 2023-02-27 NOTE — Op Note (Signed)
 Breast Reduction Op note:    DATE OF PROCEDURE: 02/27/2023  LOCATION: [facility]  SURGEON: Foster Simpson, DO  ASSISTANT: Keenan Bachelor, PA  PREOPERATIVE DIAGNOSIS 1. Macromastia 2. Neck Pain 3. Back Pain  POSTOPERATIVE DIAGNOSIS 1. Macromastia 2. Neck Pain 3. Back Pain  PROCEDURES 1. Bilateral breast reduction.  Right reduction 990 g, Left reduction 1100 g  COMPLICATIONS: None.  DRAINS: bilateral  INDICATIONS FOR PROCEDURE Jodi Downs is a 28 y.o. year-old female born on Feb 03, 1995,with a history of symptomatic macromastia with concominant back pain, neck pain, shoulder grooving from her bra.   MRN: [id number]  CONSENT Informed consent was obtained directly from the patient. The risks, benefits and alternatives were fully discussed. Specific risks including but not limited to bleeding, infection, hematoma, seroma, scarring, pain, nipple necrosis, asymmetry, poor cosmetic results, and need for further surgery were discussed. The patient's questions were answered.  DESCRIPTION OF PROCEDURE  Patient was brought into the operating room and rested on the operating room table in the supine position.  SCDs were placed and appropriate padding was performed.  Antibiotics were given. The patient underwent general anesthesia and the chest was prepped and draped in a sterile fashion.  A timeout was performed and all information was confirmed to be correct by those in the room. Tumescent was placed in the lateral breast adipose.  Liposuction was done on each side laterally (300 cc right, 200 cc left).  Right side: Preoperative markings were confirmed.  Incision lines were injected with local containing epinephrine.  After waiting for vasoconstriction, the marked lines were incised with a #15 blade.  A Wise-pattern superomedial breast reduction was drawn.  The patient was aware we would attempt to do this technique but may need to convert to amputation  technique.  The periareolar skin was de-epithelialized.  The bovie was used to create dissect each lateral incision to the chest wall while keeping adipose on the muscle. The inferior breast tissue was resected from the areola inferiorly.  The areola was too far down and we needed to include its location for the resection.  Therefore the areola was removed as a full thickness graft.  It was placed in the proper location superiorly and sutured into place with the 4-0 Vicryl.  Hemostasis was achieved with electrocautery. Experel and cellerate were placed in the pocket. Additional 4-0 Monocryl was used to approximate the areola skin.  The bolster was applied consisting of xeroform and cotton balls.  This was secured using the Vicryl sutures. The vertical limb and inframammary fold were closed.  The deep tissues were approximated with 3-0 PDS sutures.  The skin was closed with deep dermal 3-0 Monocryl.     Left side: Right side: Preoperative markings were confirmed.  Incision lines were injected with local containing epinephrine.  After waiting for vasoconstriction, the marked lines were incised with a #15 blade.  A Wise-pattern superomedial breast reduction was drawn.  The patient was aware we would attempt to do this technique but may need to convert to amputation technique.  The periareolar skin was de-epithelialized.  The bovie was used to create dissect each lateral incision to the chest wall while keeping adipose on the muscle. The inferior breast tissue was resected from the areola inferiorly.  The areola was too far down and we needed to include its location for the resection.  Therefore the areola was removed as a full thickness graft.  It was placed in the proper location superiorly and sutured into  place with the 4-0 Vicryl.  Hemostasis was achieved with electrocautery. Experel and cellerate were placed in the pocket. Additional 4-0 Monocryl was used to approximate the areola skin.  The bolster was applied  consisting of xeroform and cotton balls.  This was secured using the Vicryl sutures. The vertical limb and inframammary fold were closed.  The deep tissues were approximated with 3-0 PDS sutures.  The skin was closed with deep dermal 3-0 Monocryl.  Sylk strips were applied to the incision sites with sterile dressings.  A breast binder and ABDs were placed.  The nipple and skin flaps had good capillary refill at the end of the procedure.  The patient tolerated the procedure well. The patient was allowed to wake from anesthesia and taken to the recovery room in satisfactory condition.  The advanced practice practitioner (APP) assisted throughout the case.  The APP was essential in retraction and counter traction when needed to make the case progress smoothly.  This retraction and assistance made it possible to see the tissue plans for the procedure.  The assistance was needed for blood control, tissue re-approximation and assisted with closure of the incision site.

## 2023-02-27 NOTE — Transfer of Care (Signed)
Immediate Anesthesia Transfer of Care Note  Patient: Cay Schillings  Procedure(s) Performed: BREAST REDUCTION WITH LIPOSUCTION (Bilateral: Breast)  Patient Location: PACU  Anesthesia Type:General  Level of Consciousness: drowsy  Airway & Oxygen Therapy: Patient Spontanous Breathing and Patient connected to face mask  Post-op Assessment: Report given to RN and Post -op Vital signs reviewed and stable  Post vital signs: Reviewed and stable  Last Vitals:  Vitals Value Taken Time  BP 97/50 02/27/23 1525  Temp 36.7 C 02/27/23 1525  Pulse 83 02/27/23 1527  Resp 17 02/27/23 1527  SpO2 97 % 02/27/23 1527  Vitals shown include unfiled device data.  Last Pain:  Vitals:   02/27/23 1042  TempSrc:   PainSc: 0-No pain      Patients Stated Pain Goal: 0 (02/27/23 1042)  Complications: No notable events documented.

## 2023-02-28 ENCOUNTER — Encounter (HOSPITAL_COMMUNITY): Payer: Self-pay | Admitting: Plastic Surgery

## 2023-02-28 NOTE — Anesthesia Postprocedure Evaluation (Signed)
Anesthesia Post Note  Patient: Jodi Downs  Procedure(s) Performed: BREAST REDUCTION WITH LIPOSUCTION (Bilateral: Breast)     Patient location during evaluation: PACU Anesthesia Type: General Level of consciousness: awake and alert Pain management: pain level controlled Vital Signs Assessment: post-procedure vital signs reviewed and stable Respiratory status: spontaneous breathing, nonlabored ventilation, respiratory function stable and patient connected to nasal cannula oxygen Cardiovascular status: blood pressure returned to baseline and stable Postop Assessment: no apparent nausea or vomiting Anesthetic complications: no   No notable events documented.  Last Vitals:  Vitals:   02/27/23 1645 02/27/23 1700  BP: 132/69 127/71  Pulse: 99 99  Resp: 20 18  Temp:  36.7 C  SpO2: 92% 95%    Last Pain:  Vitals:   02/27/23 1700  TempSrc:   PainSc: 4                  Kennieth Rad

## 2023-03-01 ENCOUNTER — Ambulatory Visit (INDEPENDENT_AMBULATORY_CARE_PROVIDER_SITE_OTHER): Payer: Medicaid Other | Admitting: Surgical

## 2023-03-01 DIAGNOSIS — M546 Pain in thoracic spine: Secondary | ICD-10-CM

## 2023-03-01 DIAGNOSIS — G8929 Other chronic pain: Secondary | ICD-10-CM

## 2023-03-01 DIAGNOSIS — N62 Hypertrophy of breast: Secondary | ICD-10-CM

## 2023-03-01 LAB — SURGICAL PATHOLOGY

## 2023-03-01 NOTE — Progress Notes (Signed)
     Patient ID: Jodi Downs, female    DOB: Jul 19, 1995, 28 y.o.   MRN: [id number]     ICD-10-CM   1. Symptomatic mammary hypertrophy  N62     2. Chronic bilateral thoracic back pain  M54.6    G89.29       History of Present Illness: Jodi Downs is a 28 y.o.  female who presents for virtual post operative evaluation after bilateral breast reduction via free nipple graft and liposuction of lateral breast with Dr.  Ulice Bold  The patient gave consent to have this visit done by telemedicine / virtual visit, two identifiers were used to identify patient. This is also consent for access the chart and treat the patient via this visit. The patient is located [place].  I, the provider, am at the office.  We spent 9 minutes together for the visit.  Joined by phone.  Patient reports she is overall doing well, she reports pain has been overall well-controlled with use of over-the-counter and prescription medications.  She is not having any infectious symptoms.  She reports that she has been drinking plenty of fluids, reports drinking lots of water each day.  She reports she is having normal bowel movements and urinating normally. She reports diet is slowly increasing.  She did lose her voice after surgery, possibly from the ET tube.  She has some questions about showering and how the procedure went.  Assessment/Plan:  Patient is overall doing well after surgery 2 days ago, she is not having any acute concerns.  She overall feels well and pain is fairly well-controlled.  All of her questions were answered to her content, we discussed avoiding showering due to free nipple graft.  We washing/bathing with washcloths or submerging lower body in tub but avoiding submerging breasts/breast incisions.  Recommend continuing to avoid strenuous activities/heavy lifting Recommend calling with questions or concerns

## 2023-03-05 ENCOUNTER — Ambulatory Visit (INDEPENDENT_AMBULATORY_CARE_PROVIDER_SITE_OTHER): Payer: Medicaid Other | Admitting: Plastic Surgery

## 2023-03-05 ENCOUNTER — Encounter: Payer: Self-pay | Admitting: Plastic Surgery

## 2023-03-05 VITALS — BP 131/78 | HR 86 | Ht 65.0 in | Wt 258.4 lb

## 2023-03-05 DIAGNOSIS — N62 Hypertrophy of breast: Secondary | ICD-10-CM

## 2023-03-05 NOTE — Progress Notes (Signed)
   Subjective:    Patient ID: Jodi Downs, female    DOB: 22-Dec-1995, 28 y.o.   MRN: [id number]  The patient is a 28 year old female here for follow-up after undergoing bilateral breast reduction last week.  She had over 900 g removed from both breasts.  She does not have any signs of hematoma seroma or infection.  She says she is feeling really good and pain has been very minimal.      Review of Systems  Constitutional:  Positive for activity change. Negative for appetite change.  Eyes: Negative.   Respiratory: Negative.    Cardiovascular: Negative.   Genitourinary: Negative.        Objective:   Physical Exam Constitutional:      Appearance: Normal appearance.  Cardiovascular:     Rate and Rhythm: Normal rate.     Pulses: Normal pulses.  Skin:    General: Skin is warm.  Neurological:     Mental Status: She is alert.  Psychiatric:        Mood and Affect: Mood normal.        Behavior: Behavior normal.        Thought Content: Thought content normal.        Judgment: Judgment normal.       Assessment & Plan:     ICD-10-CM   1. Symptomatic mammary hypertrophy  N62        I went ahead and remove the drains and the bolsters.  She should keep the Xeroform in place and change it every couple of days wait to get the area wet.  Continue with sports bra or breast binder and continue with the Lipofoam. Picture at next visit.

## 2023-03-16 DIAGNOSIS — Z419 Encounter for procedure for purposes other than remedying health state, unspecified: Secondary | ICD-10-CM | POA: Diagnosis not present

## 2023-03-19 ENCOUNTER — Ambulatory Visit: Payer: Medicaid Other | Admitting: Surgical

## 2023-03-19 VITALS — BP 121/79 | HR 86

## 2023-03-19 DIAGNOSIS — M546 Pain in thoracic spine: Secondary | ICD-10-CM

## 2023-03-19 DIAGNOSIS — N62 Hypertrophy of breast: Secondary | ICD-10-CM

## 2023-03-19 DIAGNOSIS — G8929 Other chronic pain: Secondary | ICD-10-CM

## 2023-03-19 NOTE — Progress Notes (Signed)
 Patient is a  28 y.o.-year-old female status post bilateral breast reduction via free nipple graft with Dr.  Ulice Bold. Patient is 3 weeks postop.  She reports she is overall doing well.  She has been applying Xeroform to bilateral NAC grafts.  She is not having any issues, has no questions or concerns at this time.  Chaperone present on exam Bilateral NAC grafts are healing well, she does have some scabbing present.  Bilateral breast incisions are intact and appear to be healing well. There is no erythema or cellulitic changes noted. No subcutaneous fluid collections noted with palpation.   A/P:  Patient is overall doing well after free nipple graft breast reduction 3 weeks ago.  We discussed the importance of continuing with Xeroform dressing changes daily.  We discussed adding Vaseline to the dressing changes to assist with the scabbing present on the NAC graft.  We discussed that the pigmentation is beginning to return and this will continue to occur.  She does not have any signs of infection or concern on exam.  Recommend continuing with compressive garment 24/7 until 6 weeks post-op,  avoiding strenuous activity/heavy lifting until 6 weeks post-op  Recommend following up in 2 weeks All the patient's questions were answered to their content. Recommend calling with any questions or concerns.  Pictures were obtained of the patient and placed in the chart with the patient's or guardian's permission.

## 2023-04-02 ENCOUNTER — Ambulatory Visit: Payer: Medicaid Other | Admitting: Surgical

## 2023-04-02 ENCOUNTER — Encounter: Payer: Self-pay | Admitting: Surgical

## 2023-04-02 VITALS — BP 114/72 | HR 93 | Ht 65.0 in | Wt 262.4 lb

## 2023-04-02 DIAGNOSIS — M546 Pain in thoracic spine: Secondary | ICD-10-CM

## 2023-04-02 DIAGNOSIS — N62 Hypertrophy of breast: Secondary | ICD-10-CM

## 2023-04-02 DIAGNOSIS — G8929 Other chronic pain: Secondary | ICD-10-CM

## 2023-04-02 MED ORDER — SULFAMETHOXAZOLE-TRIMETHOPRIM 800-160 MG PO TABS: 1.0000 | ORAL_TABLET | Freq: Two times a day (BID) | ORAL | 0 refills | Status: AC

## 2023-04-02 NOTE — Progress Notes (Signed)
 Patient is a 28 y.o.-year-old female status post bilateral breast reduction via free nipple graft with Dr.  Ulice Bold. Patient is 5 weeks postop.  She reports she is overall doing well.  She has been doing dressing changes to bilateral nipple grafts daily.  She does not report any infectious symptoms.  Reports she is overall feeling well.   Chaperone present on exam Bilateral free nipple grafts are healing, pigmentation is returning.  She does have some granulation tissue still present on bilateral NAC's. She does have some erythema of the left breast and some mild skin thickening.  It does not yet appear cellulitic.  There is no subcutaneous fluid collection or fluctuance noted in this area. She does have a small pinhole opening of the left breast at the T-junction. There is no obvious subcutaneous fluid collections noted of either breast.   A/P:  Patient has some signs of concern for erysipelas versus early cellulitis of the left breast just adjacent to the vertical limb on the lateral side.  Discussed this with patient, will prophylactically treat with Bactrim for 5 days and have close follow-up via telephone in 1 week.  She is encouraged to call if her symptoms worsen change or she has any concerns.  She is not having any systemic symptoms at this time.  Recommend Vaseline and gauze to bilateral nipple areolar complexes until they have completely epithelialized.  Recommend Vaseline and gauze to left breast wound.  Recommend continuing with compressive garment 24/7 until 6 weeks post-op,  avoiding strenuous activity/heavy lifting until 6 weeks post-op  Recommend following up in 1 week via telephone.  She is encouraged to follow-up sooner if she has any concerning changes.  Pictures were obtained of the patient and placed in the chart with the patient's or guardian's permission.   All of the patient's questions were answered to their content. Recommend calling with any questions or  concerns.

## 2023-04-08 ENCOUNTER — Telehealth: Payer: Self-pay | Admitting: Surgical

## 2023-04-08 ENCOUNTER — Other Ambulatory Visit (INDEPENDENT_AMBULATORY_CARE_PROVIDER_SITE_OTHER): Payer: Self-pay | Admitting: Family Medicine

## 2023-04-08 DIAGNOSIS — E559 Vitamin D deficiency, unspecified: Secondary | ICD-10-CM

## 2023-04-08 NOTE — Telephone Encounter (Signed)
 Vmail full sent a mychart message for the pt to call the office.

## 2023-04-09 ENCOUNTER — Ambulatory Visit (INDEPENDENT_AMBULATORY_CARE_PROVIDER_SITE_OTHER): Admitting: Surgical

## 2023-04-09 DIAGNOSIS — M546 Pain in thoracic spine: Secondary | ICD-10-CM

## 2023-04-09 DIAGNOSIS — G8929 Other chronic pain: Secondary | ICD-10-CM

## 2023-04-09 DIAGNOSIS — N62 Hypertrophy of breast: Secondary | ICD-10-CM

## 2023-04-09 NOTE — Progress Notes (Signed)
 28 year old female here for virtual visit to discuss left breast redness after bilateral breast reduction via free nipple graft technique on 02/27/2023.  She is 6 weeks postop.  She reports she is overall doing well, reports the redness resolved fairly quickly, reports the next day after her appointment she felt that the redness had resolved.  She otherwise is doing really well, has noticed that the nipples are continuing to heal, reports some scabbing still present but otherwise feels as if it looks much better.  She does not have any specific concerns today.  She is scheduled for follow-up in 1 week for reevaluation in office.  Encouraged her to call with questions or concerns  The patient gave consent to have this visit done by telemedicine / virtual visit, two identifiers were used to identify patient. This is also consent for access the chart and treat the patient via this visit. The patient is located in [place].  I, the provider, am at the office.  We spent 2 minutes together for the visit.  Joined by telephone.

## 2023-04-17 ENCOUNTER — Ambulatory Visit (INDEPENDENT_AMBULATORY_CARE_PROVIDER_SITE_OTHER): Admitting: Surgical

## 2023-04-17 VITALS — BP 125/84 | HR 83

## 2023-04-17 DIAGNOSIS — M546 Pain in thoracic spine: Secondary | ICD-10-CM

## 2023-04-17 DIAGNOSIS — N62 Hypertrophy of breast: Secondary | ICD-10-CM

## 2023-04-17 DIAGNOSIS — G8929 Other chronic pain: Secondary | ICD-10-CM

## 2023-04-17 NOTE — Progress Notes (Signed)
 Patient is a 28 y.o.-year-old female status post bilateral breast reduction with Dr.  Ulice Bold. Patient is 7 weeks postop. Of note, she had free nipple graft technique.   Chaperone present on exam Bilateral NAC grafts are well-healed, pigmentation is beginning to return.  She does have some light scabbing noted bilaterally.  Right breast incisions are intact and well-healed.  Left breast incisions are overall intact, she does have a small wound of the left breast just adjacent to the T-junction that is approximately 2 x 8 mm.  There is healthy granulation tissue noted.  No surrounding erythema or cellulitic changes.  Bilateral breasts are soft, nontender. There is no erythema or cellulitic changes noted. No obvious subcutaneous fluid collections noted with palpation.   A/P:  Recommend continuing with compressive garment 24/7 until 6 weeks post-op,  avoiding strenuous activity/heavy lifting until 6 weeks post-op  Recommend Vaseline and gauze to left breast wound, discussed with patient that this will continue to heal over the next few weeks.  We discussed options for following up in a few weeks or following up as needed.  Patient felt comfortable keeping an eye on the left breast wound and discussed that she would call if she had any questions or concerns.  Recommend following up as needed.  All of the patient's questions were answered to their content. Recommend calling with any questions or concerns.  Pictures were obtained of the patient and placed in the chart with the patient's or guardian's permission.

## 2023-04-27 DIAGNOSIS — Z419 Encounter for procedure for purposes other than remedying health state, unspecified: Secondary | ICD-10-CM | POA: Diagnosis not present

## 2023-05-27 DIAGNOSIS — Z419 Encounter for procedure for purposes other than remedying health state, unspecified: Secondary | ICD-10-CM | POA: Diagnosis not present

## 2023-06-27 DIAGNOSIS — Z419 Encounter for procedure for purposes other than remedying health state, unspecified: Secondary | ICD-10-CM | POA: Diagnosis not present

## 2023-07-27 DIAGNOSIS — Z419 Encounter for procedure for purposes other than remedying health state, unspecified: Secondary | ICD-10-CM | POA: Diagnosis not present

## 2023-08-27 DIAGNOSIS — Z419 Encounter for procedure for purposes other than remedying health state, unspecified: Secondary | ICD-10-CM | POA: Diagnosis not present

## 2023-09-27 DIAGNOSIS — Z419 Encounter for procedure for purposes other than remedying health state, unspecified: Secondary | ICD-10-CM | POA: Diagnosis not present

## 2023-12-27 DIAGNOSIS — Z419 Encounter for procedure for purposes other than remedying health state, unspecified: Secondary | ICD-10-CM | POA: Diagnosis not present
